# Patient Record
Sex: Male | Born: 1940
Health system: Southern US, Community
[De-identification: ages and names within clinical notes are randomized; demographics above are authoritative.]

## PROBLEM LIST (undated history)

## (undated) DIAGNOSIS — I350 Nonrheumatic aortic (valve) stenosis: Secondary | ICD-10-CM

## (undated) DIAGNOSIS — N200 Calculus of kidney: Secondary | ICD-10-CM

## (undated) DIAGNOSIS — R011 Cardiac murmur, unspecified: Secondary | ICD-10-CM

## (undated) DIAGNOSIS — I2510 Atherosclerotic heart disease of native coronary artery without angina pectoris: Secondary | ICD-10-CM

## (undated) DIAGNOSIS — K219 Gastro-esophageal reflux disease without esophagitis: Secondary | ICD-10-CM

## (undated) HISTORY — DX: Gastro-esophageal reflux disease without esophagitis: K21.9

## (undated) HISTORY — DX: Atherosclerotic heart disease of native coronary artery without angina pectoris: I25.10

## (undated) HISTORY — DX: Nonrheumatic aortic (valve) stenosis: I35.0

## (undated) HISTORY — DX: Calculus of kidney: N20.0

## (undated) HISTORY — DX: Cardiac murmur, unspecified: R01.1

---

## 2009-11-18 HISTORY — PX: COLON RESECTION: SHX5231

## 2012-11-19 ENCOUNTER — Ambulatory Visit: Payer: Self-pay | Admitting: Internal Medicine

## 2014-10-28 HISTORY — PX: COLONOSCOPY: SHX174

## 2015-10-10 ENCOUNTER — Ambulatory Visit: Payer: Self-pay | Admitting: Allergy and Immunology

## 2015-11-21 ENCOUNTER — Encounter: Payer: Self-pay | Admitting: Allergy and Immunology

## 2015-11-21 ENCOUNTER — Ambulatory Visit (INDEPENDENT_AMBULATORY_CARE_PROVIDER_SITE_OTHER): Payer: Medicare Other | Admitting: Allergy and Immunology

## 2015-11-21 VITALS — BP 122/72 | HR 72 | Temp 97.5°F | Resp 16 | Ht 66.54 in | Wt 138.7 lb

## 2015-11-21 DIAGNOSIS — J3489 Other specified disorders of nose and nasal sinuses: Secondary | ICD-10-CM | POA: Diagnosis not present

## 2015-11-21 DIAGNOSIS — J3089 Other allergic rhinitis: Secondary | ICD-10-CM

## 2015-11-21 MED ORDER — IPRATROPIUM BROMIDE 0.06 % NA SOLN
NASAL | Status: DC
Start: 2015-11-21 — End: 2016-06-18

## 2015-11-21 NOTE — Progress Notes (Signed)
[facility]    NEW PATIENT NOTE  Referring Provider: No ref. provider found Primary Provider:  Melinda Crutch, MD Date of office visit: 11/21/2015    Subjective:   Chief Complaint:  Peter Potts is a 75 y.o. male with a chief complaint of Medication Problem  who presents to the clinic with the following problems:  HPI Comments:  Peter Potts presents to this clinic on 11/21/2015 in evaluation of allergic rhinitis. Occur as a long history of allergic rhinitis dating back many years for which he uses Zyrtec which works relatively well. He is somewhat concerned about the consistent use of Zyrtec secondary to some media reports of associated dementia with the use of this medication. As well, he has noticed that he does get some mucus in the back of his throat and some throat clearing when he swims. It is only during the swimming episode that this occurs. He rarely gets any problems while he has not swimming. He does not have any associated symptoms consistent with LPR and he has no classic reflux. He swims on a pretty consistent basis. He does have a history of intermittent asthma and exercise-induced asthma in the past but this has been relatively quiescent and he has not used a short-acting bronchodilator in years.   History reviewed. No pertinent past medical history.  Past Surgical History  Procedure Laterality Date  . Colon resection  2011    No current outpatient prescriptions on file prior to visit.   No current facility-administered medications on file prior to visit.    Meds ordered this encounter  Medications  . ipratropium (ATROVENT) 0.06 % nasal spray    Sig: Use 1 to 2 sprays in each nostril every 6 hours if needed to dry up nose.    Dispense:  15 mL    Refill:  5    No Known Allergies  Review of systems negative except as noted in HPI / PMHx or noted below:  Review of Systems  Constitutional: Negative.    HENT: Negative.   Eyes: Negative.   Respiratory: Negative.   Cardiovascular: Negative.   Gastrointestinal: Negative.        Status post partial colectomy for diverticulitis  Genitourinary: Negative.   Musculoskeletal: Negative.   Skin: Negative.   Neurological: Negative.   Endo/Heme/Allergies: Negative.   Psychiatric/Behavioral: Negative.     Family History  Problem Relation Age of Onset  . Heart Problems Paternal Grandfather     Social History   Social History  . Marital Status: Married    Spouse Name: N/A  . Number of Children: N/A  . Years of Education: N/A   Occupational History  . Not on file.   Social History Main Topics  . Smoking status: Never Smoker   . Smokeless tobacco: Never Used  . Alcohol Use: Not on file  . Drug Use: Not on file  . Sexual Activity: Not on file   Other Topics Concern  . Not on file   Social History Narrative  . No narrative on file    Environmental and Social history  Occur lives in a house with a dry environment, no animals located inside the household, carpeting in the bedroom, plastic on the bed and pillow, and no smoking ongoing with inside the household.   Objective:   Filed Vitals:   11/21/15 1419  BP: 122/72  Pulse: 72  Temp: 97.5 F (36.4 C)  Resp: 16  Height: 5' 6.53" (169 cm) Weight: 138 lb 10.7 oz (62.9 kg)  Physical Exam  Constitutional: He is well-developed, well-nourished, and in no distress. No distress.  HENT:  Head: Normocephalic. Head is without right periorbital erythema and without left periorbital erythema.  Right Ear: Tympanic membrane, external ear and ear canal normal.  Left Ear: Tympanic membrane, external ear and ear canal normal.  Nose: Nose normal. No mucosal edema or rhinorrhea.  Mouth/Throat: Oropharynx is clear and moist and mucous membranes are normal. No oropharyngeal exudate.  Eyes: Conjunctivae and lids are normal. Pupils are equal, round, and reactive to light.  Neck: Trachea  normal. No tracheal deviation present. No thyromegaly present.  Cardiovascular: Normal rate, regular rhythm, S1 normal, S2 normal and normal heart sounds.   No murmur heard. Pulmonary/Chest: Effort normal. No stridor. No tachypnea. No respiratory distress. He has no wheezes. He has no rales. He exhibits no tenderness.  Abdominal: Soft. He exhibits no distension and no mass. There is no hepatosplenomegaly. There is no tenderness. There is no rebound and no guarding.  Musculoskeletal: He exhibits no edema or tenderness.  Lymphadenopathy:       Head (right side): No tonsillar adenopathy present.       Head (left side): No tonsillar adenopathy present.    He has no cervical adenopathy.    He has no axillary adenopathy.  Neurological: He is alert. Gait normal.  Skin: No rash noted. He is not diaphoretic. No erythema. No pallor. Nails show no clubbing.  Psychiatric: Mood and affect normal.     Diagnostics: None  Assessment and Plan:    1. Other allergic rhinitis   2. Rhinorrhea      1. Allegra 180 one tablet one time per day  2. Can use nasal ipratropium 0.06% 1-2 sprays each nostril every 6 hours if needed to dry up nose. Can use 15-20 minutes before swimming.  3. Further evaluation?  I will have Peter Potts use the therapy mentioned above and he will keep in contact with me noting his response. He will contact me should he have continued problems in the face of this treatment. I did switch his Zyrtec to Allegra given his concern about possible Zyrtec-induced dementia. Allegra should not cross into the brain and thus there should be no risk of developing dementia with the use of this medication. He can try to use nasal ipratropium to dry up his swimming-induced mucus production. He'll follow-up in this clinic as needed.   Allena Katz, MD [place]

## 2015-11-21 NOTE — Patient Instructions (Signed)
  1. Allegra 180 one tablet one time per day  2. Can use nasal ipratropium 0.06% 1-2 sprays each nostril every 6 hours if needed to dry up nose. Can use 15-20 minutes before swimming.  3. Further evaluation?

## 2015-12-08 ENCOUNTER — Other Ambulatory Visit: Payer: Self-pay | Admitting: Family Medicine

## 2015-12-08 DIAGNOSIS — R0989 Other specified symptoms and signs involving the circulatory and respiratory systems: Secondary | ICD-10-CM

## 2015-12-12 ENCOUNTER — Ambulatory Visit
Admission: RE | Admit: 2015-12-12 | Discharge: 2015-12-12 | Disposition: A | Discharge status: Home / Self Care | Payer: Medicare Other | Source: Ambulatory Visit | Attending: Family Medicine | Admitting: Family Medicine

## 2015-12-12 DIAGNOSIS — R0989 Other specified symptoms and signs involving the circulatory and respiratory systems: Secondary | ICD-10-CM

## 2016-02-05 ENCOUNTER — Ambulatory Visit: Payer: Medicare Other | Admitting: Cardiology

## 2016-02-07 ENCOUNTER — Ambulatory Visit (INDEPENDENT_AMBULATORY_CARE_PROVIDER_SITE_OTHER): Payer: Medicare Other | Admitting: Cardiology

## 2016-02-07 ENCOUNTER — Encounter: Payer: Self-pay | Admitting: Cardiology

## 2016-02-07 VITALS — BP 124/60 | HR 87 | Ht 66.5 in | Wt 138.0 lb

## 2016-02-07 DIAGNOSIS — R011 Cardiac murmur, unspecified: Secondary | ICD-10-CM | POA: Diagnosis not present

## 2016-02-07 NOTE — Patient Instructions (Signed)
Your physician recommends that you continue on your current medications as directed. Please refer to the Current Medication list given to you today.  Your physician has requested that you have an echocardiogram. Echocardiography is a painless test that uses sound waves to create images of your heart. It provides your doctor with information about the size and shape of your heart and how well your heart's chambers and valves are working. This procedure takes approximately one hour. There are no restrictions for this procedure.  Your physician wants you to follow-up in: 1 year with Dr. Skains.  You will receive a reminder letter in the mail two months in advance. If you don't receive a letter, please call our office to schedule the follow-up appointment.  

## 2016-02-07 NOTE — Progress Notes (Signed)
Cardiology Office Note    Date:  02/07/2016   ID:  Peter Potts, DOB 05-Jun-1941, MRN [id number]  PCP:   Melinda Crutch, MD  Cardiologist:   Candee Furbish, MD     History of Present Illness:  Peter Potts is a 75 y.o. male here for evaluation of aortic systolic murmur at the request of Dr. Harrington Challenger. No prior chest pain, no strokelike symptoms. He has had normal carotid ultrasound. Never smoked. He swims very frequently and ran for many years even a marathon.  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 <50% , minor narrowing on carotid ultrasound 12/12/15. He then went to Dr. Harrington Challenger who heard a heart murmur and sent him here for further evaluation.  He is retired or as he likes to say, rewired. 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  His wife would like him to gain a few pounds. Before he used to run, he weighed 160 pounds. He ran for years and then decided to spare his knees and now swims. He does this up to 4 times per week.    Past Medical History  Diagnosis Date  . Heart murmur   . Kidney stones     Past Surgical History  Procedure Laterality Date  . Colon resection  2011    Outpatient Prescriptions Prior to Visit  Medication Sig Dispense Refill  . aspirin 81 MG tablet Take 81 mg by mouth daily.    . fexofenadine (ALLEGRA) 180 MG tablet Take 180 mg by mouth daily.    . fluticasone (FLONASE) 50 MCG/ACT nasal spray Place 1 spray into both nostrils daily.    Marland Kitchen ipratropium (ATROVENT) 0.06 % nasal spray Use 1 to 2 sprays in each nostril every 6 hours if needed to dry up nose. 15 mL 5  . Omega-3 Fatty Acids (FISH OIL PO) Take 2,400 mg by mouth daily.    . Probiotic Product (SOLUBLE FIBER/PROBIOTICS PO) Take by mouth 2 (two) times daily.    . Red Yeast Rice Extract (RED YEAST RICE PO) Take 1 tablet by mouth daily.     No facility-administered medications prior to visit.     Allergies:   Review of patient's allergies indicates no known allergies.   Social History    Social History  . Marital Status: Married    Spouse Name: N/A  . Number of Children: N/A  . Years of Education: N/A   Social History Main Topics  . Smoking status: Never Smoker   . Smokeless tobacco: Never Used  . Alcohol Use: None  . Drug Use: None  . Sexual Activity: Not Asked   Other Topics Concern  . None   Social History Narrative     Family History:  The patient's family history includes Heart Problems in his paternal grandfather.   ROS:   Please see the history of present illness.    ROS All other systems reviewed and are negative.   PHYSICAL EXAM:   VS:  BP 124/60 mmHg  Pulse 87  Ht 5' 6.5" (1.689 m)  Wt 138 lb (62.596 kg)  BMI 21.94 kg/m2   GEN: Well nourished, well developed, in no acute distress HEENT: normal Neck: no JVD, left greater than right carotid bruit, no masses Cardiac: RRR; 2/6 holosystolic murmur heard best at apex but also heard in the aortic valve position as well, no rubs, or gallops,no edema  Respiratory:  clear to auscultation bilaterally, normal work of breathing GI: soft, nontender, nondistended, +  BS MS: no deformity or atrophy Skin: warm and dry, no rash Neuro:  Alert and Oriented x 3, Strength and sensation are intact Psych: euthymic mood, full affect  Wt Readings from Last 3 Encounters:  02/07/16 138 lb (62.596 kg)  11/21/15 138 lb 10.7 oz (62.9 kg)      Studies/Labs Reviewed:   EKG:  EKG is ordered today.  The ekg ordered today demonstrates Normal sinus rhythm heart rate 87 bpm, no other abnormalities.  Recent Labs: No results found for requested labs within last 365 days.   Lipid Panel No results found for: CHOL, TRIG, HDL, CHOLHDL, VLDL, LDLCALC, LDLDIRECT  Previously total cholesterol was 210 range, with red East rice it is in the 150 range. Triglycerides are improved as well.  Additional studies/ records that were reviewed today include:  Prior office notes reviewed, EKG reviewed, labs  discussed    ASSESSMENT:    1. Murmur      PLAN:  In order of problems listed above:  Heart murmur -Heart murmur seems to be loudest at the apical position and perhaps holosystolic. This may be a mitral regurgitant murmur. I do agree however that I hear in the aortic valve position as well. We will check an echocardiogram to elucidate anatomy. -We discussed the implications of murmurs, potential surgeries, he is currently asymptomatic. -I encouraged him to gain approximate 5 pounds. Do not want to see him drop his BMI below 20. -I'm fine with him continuing with red Yeast Rice extract as well as omega-3. He states that this helped his cholesterol. He has been on this for 10-15 years. He has not tried statin therapy. He does not have any myalgias with these medications/supplements. -Continue with his daily active exercise. He will let me know if any symptoms change. Continue aspirin for prevention.  Carotid bruit -Less than 50% stenosis bilaterally, minor narrowing noted. Explained to him that sometimes with even minor plaque buildup, we would advocate for traditional statin therapy in these situations.     Medication Adjustments/Labs and Tests Ordered: Current medicines are reviewed at length with the patient today.  Concerns regarding medicines are outlined above.  Medication changes, Labs and Tests ordered today are listed in the Patient Instructions below. Patient Instructions  Your physician recommends that you continue on your current medications as directed. Please refer to the Current Medication list given to you today. Your physician has requested that you have an echocardiogram. Echocardiography is a painless test that uses sound waves to create images of your heart. It provides your doctor with information about the size and shape of your heart and how well your heart's chambers and valves are working. This procedure takes approximately one hour. There are no restrictions for  this procedure.  Your physician wants you to follow-up in: 1 year with Dr. Marlou Porch.  You will receive a reminder letter in the mail two months in advance. If you don't receive a letter, please call our office to schedule the follow-up appointment.        Bobby Rumpf, MD  02/07/2016 3:56 PM    [facility] [place], [place], [place]  [address] Phone: [phone]; Fax: [phone]

## 2016-02-23 ENCOUNTER — Other Ambulatory Visit: Payer: Self-pay

## 2016-02-23 ENCOUNTER — Ambulatory Visit (HOSPITAL_COMMUNITY): Payer: Medicare Other | Attending: Internal Medicine

## 2016-02-23 DIAGNOSIS — I517 Cardiomegaly: Secondary | ICD-10-CM | POA: Insufficient documentation

## 2016-02-23 DIAGNOSIS — I351 Nonrheumatic aortic (valve) insufficiency: Secondary | ICD-10-CM | POA: Insufficient documentation

## 2016-02-23 DIAGNOSIS — R011 Cardiac murmur, unspecified: Secondary | ICD-10-CM | POA: Diagnosis not present

## 2016-06-18 ENCOUNTER — Ambulatory Visit (INDEPENDENT_AMBULATORY_CARE_PROVIDER_SITE_OTHER): Payer: Medicare Other | Admitting: Cardiology

## 2016-06-18 ENCOUNTER — Encounter (INDEPENDENT_AMBULATORY_CARE_PROVIDER_SITE_OTHER): Payer: Self-pay

## 2016-06-18 ENCOUNTER — Encounter: Payer: Self-pay | Admitting: Cardiology

## 2016-06-18 VITALS — BP 118/68 | HR 76 | Ht 67.5 in | Wt 137.4 lb

## 2016-06-18 DIAGNOSIS — Z79899 Other long term (current) drug therapy: Secondary | ICD-10-CM | POA: Diagnosis not present

## 2016-06-18 DIAGNOSIS — I6529 Occlusion and stenosis of unspecified carotid artery: Secondary | ICD-10-CM | POA: Insufficient documentation

## 2016-06-18 DIAGNOSIS — E785 Hyperlipidemia, unspecified: Secondary | ICD-10-CM | POA: Diagnosis not present

## 2016-06-18 DIAGNOSIS — I351 Nonrheumatic aortic (valve) insufficiency: Secondary | ICD-10-CM | POA: Diagnosis not present

## 2016-06-18 DIAGNOSIS — I6523 Occlusion and stenosis of bilateral carotid arteries: Secondary | ICD-10-CM

## 2016-06-18 MED ORDER — ATORVASTATIN CALCIUM 20 MG PO TABS: 20.0000 mg | ORAL_TABLET | Freq: Every day | ORAL | 11 refills | Status: DC

## 2016-06-18 NOTE — Progress Notes (Signed)
Cardiology Office Note    Date:  06/18/2016   ID:  Peter Potts, DOB January 16, 1941, MRN [id number]  PCP:   Melinda Crutch, MD  Cardiologist:   Candee Furbish, MD     History of Present Illness:  Peter Potts is a 75 y.o. male Peter Potts) here for Follow-up of aortic murmur Dr. Harrington Challenger. No prior chest pain, no strokelike symptoms. Carotid ultrasound showed less than 50% carotid plaque on ultrasound. Never smoked. He swims very frequently and ran for many years even a marathon.  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 <50% , minor narrowing on carotid ultrasound 12/12/15. He then went to Dr. Harrington Challenger who heard a heart murmur and sent him here for further evaluation.  He is retired or as he likes to say, rewired. 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  His wife would like him to gain a few pounds. Before he used to run, he weighed 160 pounds. He ran for years and then decided to spare his knees and now swims. He does this up to 4 times per week.  We discussed statin therapy today for plaque stabilization of carotids.    Past Medical History:  Diagnosis Date  . Heart murmur   . Kidney stones     Past Surgical History:  Procedure Laterality Date  . COLON RESECTION  2011    Outpatient Medications Prior to Visit  Medication Sig Dispense Refill  . aspirin 81 MG tablet Take 81 mg by mouth daily.    . fexofenadine (ALLEGRA) 180 MG tablet Take 180 mg by mouth daily.    . Omega-3 Fatty Acids (FISH OIL PO) Take 2,400 mg by mouth daily.    . Probiotic Product (SOLUBLE FIBER/PROBIOTICS PO) Take by mouth 2 (two) times daily.    . Red Yeast Rice Extract (RED YEAST RICE PO) Take 1 tablet by mouth daily.    . fluticasone (FLONASE) 50 MCG/ACT nasal spray Place 1 spray into both nostrils daily.    Marland Kitchen ipratropium (ATROVENT) 0.06 % nasal spray Use 1 to 2 sprays in each nostril every 6 hours if needed to dry up nose. 15 mL 5   No facility-administered medications prior to visit.       Allergies:   Review of patient's allergies indicates no known allergies.   Social History   Social History  . Marital status: Married    Spouse name: N/A  . Number of children: N/A  . Years of education: N/A   Social History Main Topics  . Smoking status: Never Smoker  . Smokeless tobacco: Never Used  . Alcohol use None  . Drug use: Unknown  . Sexual activity: Not Asked   Other Topics Concern  . None   Social History Narrative  . None     Family History:  The patient's family history includes Heart Problems in his paternal grandfather.   ROS:   Please see the history of present illness.    ROS All other systems reviewed and are negative.   PHYSICAL EXAM:   VS:  BP 118/68   Pulse 76   Ht 5' 7.5" (1.715 m)   Wt 137 lb 6.4 oz (62.3 kg)   BMI 21.20 kg/m    GEN: Well nourished, well developed, in no acute distress  HEENT: normal  Neck: no JVD, left greater than right carotid bruit, no masses Cardiac: RRR; 2/6 holosystolic murmur heard best at apex but also heard in the aortic valve position as  well, no rubs, or gallops,no edema  Respiratory:  clear to auscultation bilaterally, normal work of breathing GI: soft, nontender, nondistended, + BS MS: no deformity or atrophy  Skin: warm and dry, no rash Neuro:  Alert and Oriented x 3, Strength and sensation are intact Psych: euthymic mood, full affect  Wt Readings from Last 3 Encounters:  06/18/16 137 lb 6.4 oz (62.3 kg)  02/07/16 138 lb (62.6 kg)  11/21/15 138 lb 10.7 oz (62.9 kg)      Studies/Labs Reviewed:   EKG:  EKG is ordered today.  The ekg ordered today demonstrates Normal sinus rhythm heart rate 87 bpm, no other abnormalities.  ECHO 02/23/16:  - Left ventricle: The cavity size was normal. Wall thickness was   increased in a pattern of mild LVH. Systolic function was normal.   The estimated ejection fraction was in the range of 50% to 55%.   Doppler parameters are consistent with abnormal left  ventricular   relaxation (grade 1 diastolic dysfunction). - Aortic valve: There was mild regurgitation.  Recent Labs: No results found for requested labs within last 8760 hours.   Lipid Panel No results found for: CHOL, TRIG, HDL, CHOLHDL, VLDL, LDLCALC, LDLDIRECT  Previously total cholesterol was 210 range, with red East rice it is in the 150 range. Triglycerides are improved as well.  Additional studies/ records that were reviewed today include:  Prior office notes reviewed, EKG reviewed, labs discussed    ASSESSMENT:    1. Aortic regurgitation   2. Carotid artery plaque, bilateral   3. Hyperlipidemia   4. Encounter for long-term (current) use of medications      PLAN:  In order of problems listed above:  Heart murmur/ Aortic regurgitation mild.  -Aortic regurgitant murmur noted, mild, should not lead to any further surgeries. Discussed at length. Continue to monitor. May not need future echocardiogram unless symptoms change. -I encouraged him to gain approximate 5 pounds. Do not want to see him drop his BMI below 20. -Given his carotid plaque, I've encouraged him to try atorvastatin 20 g once a day. He is hesitant at first because of the things he has heard about statin therapy and I tried to counsel him on this and the benefit of prevention especially in the setting of plaque/atherosclerotic process noted. We will start and have him come back in in 3 months for lipid panel and ALT. He will let me know if any symptoms arise. Continue aspirin for prevention.  Carotid bruit -Less than 50% stenosis bilaterally, minor narrowing noted. Explained to him that sometimes with even minor plaque buildup, we would advocate for traditional statin therapy in these situations. He is willing to try atorvastatin 20 mg.  Secondary prevention  - Since there is evidence of carotid atherosclerosis, we will start atorvastatin.  25 minutes spent with patient and consultation,  discussion.   Medication Adjustments/Labs and Tests Ordered: Current medicines are reviewed at length with the patient today.  Concerns regarding medicines are outlined above.  Medication changes, Labs and Tests ordered today are listed in the Patient Instructions below. Patient Instructions  Medication Instructions:  Please start Atorvastatin 20 mg a day. Continue all other medications as listed.  Labwork: Please have blood work in 3 months (fsting lipid/ALT)  Follow-Up: Follow up in 1 year with Dr. Marlou Porch.  You will receive a letter in the mail 2 months before you are due.  Please call us when you receive this letter to schedule your follow up appointment.  If  you need a refill on your cardiac medications before your next appointment, please call your pharmacy.  Thank you for choosing [facility]!!            Signed, Candee Furbish, MD  06/18/2016 10:12 AM    [address], [place], [place]  [address] Phone: [phone]; Fax: [phone]

## 2016-06-18 NOTE — Patient Instructions (Signed)
Medication Instructions:  Please start Atorvastatin 20 mg a day. Continue all other medications as listed.  Labwork: Please have blood work in 3 months (fsting lipid/ALT)  Follow-Up: Follow up in 1 year with Dr. Marlou Porch.  You will receive a letter in the mail 2 months before you are due.  Please call us when you receive this letter to schedule your follow up appointment.  If you need a refill on your cardiac medications before your next appointment, please call your pharmacy.  Thank you for choosing [facility]!!

## 2016-08-02 ENCOUNTER — Encounter: Payer: Self-pay | Admitting: Cardiology

## 2016-08-20 ENCOUNTER — Telehealth: Payer: Self-pay | Admitting: *Deleted

## 2016-08-20 NOTE — Telephone Encounter (Signed)
Called to speak with pt since I received a MyChart message that pt had not read the response to his concern.  He reports he did see it.  He held his atorvastatin for 3 or 4 days and has been better.  He restarted meds about 1& 1/2 weeks ago.  He states he has only noticed a sore place in his left arm that comes and goes.  Advised to call back he if notices increase in muscle pain.  He is in agreement.  This message is to inform you that the patient has not yet read the following message. (Notification date: August 19, 2016)  RE: Non-Urgent Medical Question   From Shellia Cleverly, RN To Sent 08/05/2016 11:54 AM  You may hold your Atorvastatin to see if the soreness resolves.  Please let us know if it does resolve or stays the same.  I would see your PCP to determine the cause of your fever.   Thank you   Previous Messages    ----- Message -----   From: Peter Potts   Sent: 08/02/2016 4:16 PM EDT    To: Candee Furbish, MD  Subject: Non-Urgent Medical Question   I have been taking Atorvastatin for about 45 days. Experiencing aches and soreness in legs and arms plus a slight fever of 100.4 yesterday. Fever normal today. Do I need to change statins?   Peter Potts  [phone]  [email or web address]

## 2016-09-19 ENCOUNTER — Other Ambulatory Visit: Payer: Medicare Other | Admitting: *Deleted

## 2016-09-19 DIAGNOSIS — E785 Hyperlipidemia, unspecified: Secondary | ICD-10-CM

## 2016-09-19 DIAGNOSIS — Z79899 Other long term (current) drug therapy: Secondary | ICD-10-CM

## 2016-09-19 LAB — LIPID PANEL
CHOL/HDL RATIO: 2.6 ratio (ref <=5.0)
CHOLESTEROL: 129 mg/dL (ref 125–200)
HDL: 49 mg/dL (ref >=40)
LDL Cholesterol: 64 mg/dL (ref <130)
TRIGLYCERIDES: 79 mg/dL (ref <150)
VLDL: 16 mg/dL (ref <30)

## 2016-09-19 LAB — ALT: ALT: 12 U/L (ref 9–46)

## 2016-09-23 ENCOUNTER — Telehealth: Payer: Self-pay | Admitting: Cardiology

## 2016-09-23 NOTE — Telephone Encounter (Signed)
New message  Pt call stating he received a call from RN . Please call back to discuss

## 2016-09-23 NOTE — Telephone Encounter (Signed)
Left detailed message of lab results and comments from Dr. Marlou Porch and advised patient to call back with questions or concerns

## 2016-11-22 ENCOUNTER — Encounter: Payer: Self-pay | Admitting: Cardiology

## 2016-12-09 DIAGNOSIS — H532 Diplopia: Secondary | ICD-10-CM | POA: Diagnosis not present

## 2017-02-06 ENCOUNTER — Ambulatory Visit: Payer: Medicare Other | Admitting: Cardiology

## 2017-03-28 DIAGNOSIS — H00012 Hordeolum externum right lower eyelid: Secondary | ICD-10-CM | POA: Diagnosis not present

## 2017-04-28 DIAGNOSIS — L570 Actinic keratosis: Secondary | ICD-10-CM | POA: Diagnosis not present

## 2017-04-28 DIAGNOSIS — B351 Tinea unguium: Secondary | ICD-10-CM | POA: Diagnosis not present

## 2017-05-12 DIAGNOSIS — H2513 Age-related nuclear cataract, bilateral: Secondary | ICD-10-CM | POA: Diagnosis not present

## 2017-05-12 DIAGNOSIS — H04123 Dry eye syndrome of bilateral lacrimal glands: Secondary | ICD-10-CM | POA: Diagnosis not present

## 2017-05-23 DIAGNOSIS — Z7982 Long term (current) use of aspirin: Secondary | ICD-10-CM | POA: Diagnosis not present

## 2017-05-23 DIAGNOSIS — J309 Allergic rhinitis, unspecified: Secondary | ICD-10-CM | POA: Diagnosis not present

## 2017-05-23 DIAGNOSIS — I7090 Unspecified atherosclerosis: Secondary | ICD-10-CM | POA: Diagnosis not present

## 2017-05-23 DIAGNOSIS — R0989 Other specified symptoms and signs involving the circulatory and respiratory systems: Secondary | ICD-10-CM | POA: Diagnosis not present

## 2017-05-23 DIAGNOSIS — B351 Tinea unguium: Secondary | ICD-10-CM | POA: Diagnosis not present

## 2017-05-23 DIAGNOSIS — Z0000 Encounter for general adult medical examination without abnormal findings: Secondary | ICD-10-CM | POA: Diagnosis not present

## 2017-05-23 DIAGNOSIS — I340 Nonrheumatic mitral (valve) insufficiency: Secondary | ICD-10-CM | POA: Diagnosis not present

## 2017-05-29 DIAGNOSIS — R69 Illness, unspecified: Secondary | ICD-10-CM | POA: Diagnosis not present

## 2017-06-18 ENCOUNTER — Other Ambulatory Visit: Payer: Self-pay | Admitting: Cardiology

## 2017-07-01 DIAGNOSIS — Z79899 Other long term (current) drug therapy: Secondary | ICD-10-CM | POA: Diagnosis not present

## 2017-07-01 DIAGNOSIS — Z131 Encounter for screening for diabetes mellitus: Secondary | ICD-10-CM | POA: Diagnosis not present

## 2017-07-01 DIAGNOSIS — Z125 Encounter for screening for malignant neoplasm of prostate: Secondary | ICD-10-CM | POA: Diagnosis not present

## 2017-07-01 DIAGNOSIS — E7800 Pure hypercholesterolemia, unspecified: Secondary | ICD-10-CM | POA: Diagnosis not present

## 2017-07-08 DIAGNOSIS — Z0000 Encounter for general adult medical examination without abnormal findings: Secondary | ICD-10-CM | POA: Diagnosis not present

## 2017-07-18 DIAGNOSIS — J302 Other seasonal allergic rhinitis: Secondary | ICD-10-CM | POA: Diagnosis not present

## 2017-07-18 DIAGNOSIS — R040 Epistaxis: Secondary | ICD-10-CM | POA: Diagnosis not present

## 2017-09-12 ENCOUNTER — Encounter: Payer: Self-pay | Admitting: Cardiology

## 2017-09-12 ENCOUNTER — Ambulatory Visit (INDEPENDENT_AMBULATORY_CARE_PROVIDER_SITE_OTHER): Payer: Medicare HMO | Admitting: Cardiology

## 2017-09-12 VITALS — BP 134/80 | HR 82 | Ht 67.5 in | Wt 133.1 lb

## 2017-09-12 DIAGNOSIS — I351 Nonrheumatic aortic (valve) insufficiency: Secondary | ICD-10-CM

## 2017-09-12 DIAGNOSIS — I6523 Occlusion and stenosis of bilateral carotid arteries: Secondary | ICD-10-CM

## 2017-09-12 DIAGNOSIS — E7800 Pure hypercholesterolemia, unspecified: Secondary | ICD-10-CM | POA: Diagnosis not present

## 2017-09-12 NOTE — Patient Instructions (Signed)
Medication Instructions:  The current medical regimen is effective;  continue present plan and medications.  Testing/Procedures: Your physician has requested that you have an echocardiogram. Echocardiography is a painless test that uses sound waves to create images of your heart. It provides your doctor with information about the size and shape of your heart and how well your heart's chambers and valves are working. This procedure takes approximately one hour. There are no restrictions for this procedure.  Your physician has requested that you have a carotid duplex. This test is an ultrasound of the carotid arteries in your neck. It looks at blood flow through these arteries that supply the brain with blood. Allow one hour for this exam. There are no restrictions or special instructions.  Follow-Up: Follow up in 1 year with Dr. Marlou Porch.  You will receive a letter in the mail 2 months before you are due.  Please call us when you receive this letter to schedule your follow up appointment.  If you need a refill on your cardiac medications before your next appointment, please call your pharmacy.  Thank you for choosing [facility]!!

## 2017-09-12 NOTE — Progress Notes (Signed)
Cardiology Office Note    Date:  09/12/2017   ID:  Peter Potts, DOB 17-Apr-1941, MRN [id number]  PCP:  Lawerance Cruel, MD  Cardiologist:   Candee Furbish, MD     History of Present Illness:  Peter Potts is a 76 y.o. male Peter Potts) here for follow-up of aortic murmur, Dr. Harrington Challenger. No prior chest pain, no strokelike symptoms. Carotid ultrasound showed less than 50% carotid plaque on ultrasound. Never smoked. He swims very frequently and ran for many years even a marathon.  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 <50% , minor narrowing on carotid ultrasound 12/12/15. He then went to Dr. Harrington Challenger who heard a heart murmur and sent him here for further evaluation.  He is retired or as he likes to say, rewired. 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  His wife would like him to gain a few pounds. Before he used to run, he weighed 160 pounds. He ran for years and then decided to spare his knees and now swims. He does this up to 4 times per week.  He is currently taking his statin medication for plaque stabilization especially given his known carotid artery plaque.  We had lengthy discussion about the utility of stress testing etc.  Since he is asymptomatic no stress testing is needed.  Coronary artery CT was discussed to determine calcium score in asymptomatic patients.  Ultimately, this would lead to current therapy that he is on.  Statin.      Past Medical History:  Diagnosis Date  . Heart murmur   . Kidney stones     Past Surgical History:  Procedure Laterality Date  . COLON RESECTION  2011    Outpatient Medications Prior to Visit  Medication Sig Dispense Refill  . aspirin 81 MG tablet Take 81 mg by mouth daily.    Marland Kitchen atorvastatin (LIPITOR) 20 MG tablet TAKE 1 TABLET (20 MG TOTAL) BY MOUTH DAILY. 30 tablet 2  . fexofenadine (ALLEGRA) 180 MG tablet Take 180 mg by mouth daily.    . Omega-3 Fatty Acids (FISH OIL PO) Take 2,400 mg by mouth daily.    . Probiotic Product  (SOLUBLE FIBER/PROBIOTICS PO) Take by mouth 2 (two) times daily.    . Red Yeast Rice Extract (RED YEAST RICE PO) Take 1 tablet by mouth daily.     No facility-administered medications prior to visit.      Allergies:   Patient has no known allergies.   Social History   Social History  . Marital status: Married    Spouse name: N/A  . Number of children: N/A  . Years of education: N/A   Social History Main Topics  . Smoking status: Never Smoker  . Smokeless tobacco: Never Used  . Alcohol use None  . Drug use: Unknown  . Sexual activity: Not Asked   Other Topics Concern  . None   Social History Narrative  . None     Family History:  The patient's family history includes Heart Problems in his paternal grandfather.   ROS:   Please see the history of present illness.    ROS All other systems reviewed and are negative.   PHYSICAL EXAM:   VS:  BP 134/80   Pulse 82   Ht 5' 7.5" (1.715 m)   Wt 133 lb 1.9 oz (60.4 kg)   SpO2 98%   BMI 20.54 kg/m    GEN: Well nourished, well developed, in no acute distress  HEENT: normal  Neck: no JVD, carotid bruits, or masses Cardiac: rrr; 2/6 systolic right upper sternal border murmur, no rubs, or gallops,no edema  Respiratory:  clear to auscultation bilaterally, normal work of breathing GI: soft, nontender, nondistended, + BS MS: no deformity or atrophy  Skin: warm and dry, no rash Neuro:  Alert and Oriented x 3, Strength and sensation are intact Psych: euthymic mood, full affect   Wt Readings from Last 3 Encounters:  09/12/17 133 lb 1.9 oz (60.4 kg)  06/18/16 137 lb 6.4 oz (62.3 kg)  02/07/16 138 lb (62.6 kg)      Studies/Labs Reviewed:   EKG:  EKG is ordered today.  The ekg ordered today demonstrates normal sinus rhythm 82 with no other abnormalities personally viewed-prior normal sinus rhythm heart rate 87 bpm, no other abnormalities.  ECHO 02/23/16:  - Left ventricle: The cavity size was normal. Wall thickness was    increased in a pattern of mild LVH. Systolic function was normal.   The estimated ejection fraction was in the range of 50% to 55%.   Doppler parameters are consistent with abnormal left ventricular   relaxation (grade 1 diastolic dysfunction). - Aortic valve: There was mild regurgitation.  Recent Labs: 09/19/2016: ALT 12   Lipid Panel    Component Value Date/Time   CHOL 129 09/19/2016 0756   TRIG 79 09/19/2016 0756   HDL 49 09/19/2016 0756   CHOLHDL 2.6 09/19/2016 0756   VLDL 16 09/19/2016 0756   LDLCALC 64 09/19/2016 0756    Previously total cholesterol was 210 range, with red yeast rice it is in the 150 range. Triglycerides are improved as well.  LDL on 07/01/17 was 74  Additional studies/ records that were reviewed today include:  Prior office notes reviewed, EKG reviewed, labs discussed    ASSESSMENT:    1. Nonrheumatic aortic valve insufficiency   2. Carotid artery plaque, bilateral   3. Pure hypercholesterolemia      PLAN:  In order of problems listed above:  Heart murmur/ Aortic regurgitation mild.  -We will check an echocardiogram to monitor stability of his aortic regurgitation.  Carotid bruit -Less than 50% stenosis bilaterally, minor narrowing noted.  -Continue with statin therapy.  No symptoms.  Hyperlipidemia -Tolerating statin therapy well.  Aggressive secondary prevention.  25 minutes spent with patient and consultation, discussion.   Medication Adjustments/Labs and Tests Ordered: Current medicines are reviewed at length with the patient today.  Concerns regarding medicines are outlined above.  Medication changes, Labs and Tests ordered today are listed in the Patient Instructions below. Patient Instructions  Medication Instructions:  The current medical regimen is effective;  continue present plan and medications.  Testing/Procedures: Your physician has requested that you have an echocardiogram. Echocardiography is a painless test that uses  sound waves to create images of your heart. It provides your doctor with information about the size and shape of your heart and how well your heart's chambers and valves are working. This procedure takes approximately one hour. There are no restrictions for this procedure.  Your physician has requested that you have a carotid duplex. This test is an ultrasound of the carotid arteries in your neck. It looks at blood flow through these arteries that supply the brain with blood. Allow one hour for this exam. There are no restrictions or special instructions.  Follow-Up: Follow up in 1 year with Dr. Marlou Porch.  You will receive a letter in the mail 2 months before you are due.  Please  call us when you receive this letter to schedule your follow up appointment.  If you need a refill on your cardiac medications before your next appointment, please call your pharmacy.  Thank you for choosing [facility]!!          Signed, Candee Furbish, MD  09/12/2017 9:10 AM    [facility] [place], [place], [place]  [address] Phone: [phone]; Fax: [phone]

## 2017-09-17 ENCOUNTER — Other Ambulatory Visit: Payer: Self-pay | Admitting: Cardiology

## 2017-09-18 ENCOUNTER — Ambulatory Visit (HOSPITAL_COMMUNITY): Payer: Medicare HMO | Attending: Cardiovascular Disease

## 2017-09-18 ENCOUNTER — Other Ambulatory Visit: Payer: Self-pay

## 2017-09-18 DIAGNOSIS — I351 Nonrheumatic aortic (valve) insufficiency: Secondary | ICD-10-CM

## 2017-09-18 LAB — ECHOCARDIOGRAM COMPLETE
AO mean calculated velocity dopler: 157 cm/s
AV Area VTI index: 0.73 cm2/m2
AV Area mean vel: 1.22 cm2
AV Mean grad: 11 mmHg
AV Peak grad: 23 mmHg
AV area mean vel ind: 0.72 cm2/m2
AV peak Index: 0.74
AV vel: 1.23
AVAREAVTI: 1.26 cm2
AVCELMEANRAT: 0.35
AVPHT: 483 ms
AVPKVEL: 240 cm/s
Ao pk vel: 0.36 m/s
Area-P 1/2: 3.01 cm2
CHL CUP MV DEC (S): 250
E decel time: 250 msec
E/e' ratio: 8.12
FS: 41 % (ref 28–44)
IVS/LV PW RATIO, ED: 1.2
LA diam index: 1.95 cm/m2
LA vol A4C: 28.7 ml
LA vol index: 23.2 mL/m2
LASIZE: 33 mm
LAVOL: 39.3 mL
LDCA: 3.46 cm2
LEFT ATRIUM END SYS DIAM: 33 mm
LV E/e' medial: 8.12
LV PW d: 10 mm — AB (ref 0.6–1.1)
LV e' LATERAL: 7.4 cm/s
LVEEAVG: 8.12
LVOT SV: 62 mL
LVOT VTI: 17.9 cm
LVOT diameter: 21 mm
LVOTPV: 87.2 cm/s
LVOTVTI: 0.35 cm
MVPKAVEL: 53.8 m/s
MVPKEVEL: 60.1 m/s
MVSPHT: 73 ms
Peak grad: 210 mmHg
RV LATERAL S' VELOCITY: 11.7 cm/s
RV TAPSE: 20.4 mm
RV sys press: 21 mmHg
Reg peak vel: 210 cm/s
TDI e' lateral: 7.4
TDI e' medial: 7.83
TRMAXVEL: 210 cm/s
VTI: 50.5 cm
Valve area index: 0.73
Valve area: 1.23 cm2

## 2017-09-19 ENCOUNTER — Telehealth: Payer: Self-pay | Admitting: *Deleted

## 2017-09-19 DIAGNOSIS — E785 Hyperlipidemia, unspecified: Secondary | ICD-10-CM

## 2017-09-19 DIAGNOSIS — R011 Cardiac murmur, unspecified: Secondary | ICD-10-CM

## 2017-09-19 NOTE — Telephone Encounter (Signed)
Called patient to review results of echocardiogram.  He is requesting a coronary CA+ score test be ordered as discussed with Dr Marlou Porch at his last office visit.  He is aware of the cost and is willing to proceed.  Advised I will obtain an order from Dr Marlou Porch and have someone to call to schedule him.  He was grateful for the information and will await a c/b.

## 2017-09-25 NOTE — Telephone Encounter (Signed)
Agree with ordering calcium score.  Thank you. Candee Furbish, MD

## 2017-09-26 ENCOUNTER — Ambulatory Visit (HOSPITAL_COMMUNITY)
Admission: RE | Admit: 2017-09-26 | Discharge: 2017-09-26 | Disposition: A | Discharge status: Home / Self Care | Payer: Medicare HMO | Source: Ambulatory Visit | Attending: Cardiovascular Disease | Admitting: Cardiovascular Disease

## 2017-09-26 DIAGNOSIS — I6523 Occlusion and stenosis of bilateral carotid arteries: Secondary | ICD-10-CM | POA: Insufficient documentation

## 2017-10-07 ENCOUNTER — Other Ambulatory Visit: Payer: Self-pay | Admitting: *Deleted

## 2017-10-07 ENCOUNTER — Ambulatory Visit (INDEPENDENT_AMBULATORY_CARE_PROVIDER_SITE_OTHER)
Admission: RE | Admit: 2017-10-07 | Discharge: 2017-10-07 | Disposition: A | Discharge status: Home / Self Care | Payer: Self-pay | Source: Ambulatory Visit | Attending: Cardiology | Admitting: Cardiology

## 2017-10-07 DIAGNOSIS — E785 Hyperlipidemia, unspecified: Secondary | ICD-10-CM

## 2017-10-07 DIAGNOSIS — R011 Cardiac murmur, unspecified: Secondary | ICD-10-CM

## 2017-10-07 MED ORDER — ATORVASTATIN CALCIUM 40 MG PO TABS: 20.0000 mg | ORAL_TABLET | Freq: Every day | ORAL | 3 refills | Status: DC

## 2017-10-13 ENCOUNTER — Encounter: Payer: Self-pay | Admitting: Cardiology

## 2017-10-13 DIAGNOSIS — R931 Abnormal findings on diagnostic imaging of heart and coronary circulation: Secondary | ICD-10-CM

## 2017-10-13 DIAGNOSIS — E7800 Pure hypercholesterolemia, unspecified: Secondary | ICD-10-CM

## 2017-10-13 DIAGNOSIS — I351 Nonrheumatic aortic (valve) insufficiency: Secondary | ICD-10-CM

## 2017-10-15 NOTE — Telephone Encounter (Signed)
Orders placed for myoview stress testing.

## 2017-10-17 ENCOUNTER — Telehealth: Payer: Self-pay | Admitting: *Deleted

## 2017-10-17 NOTE — Telephone Encounter (Signed)
-----   Message sent from Jerline Pain, MD to Peter Potts at 10/15/2017 5:53 PM -----   [name],    After reviewing the cardiac CT results, there is evidence of coronary calcification present. I'm certainly glad that you're taking the increased statin dose. This will help stabilize plaque. Even though you're not having any current symptoms, I think it would be reasonable to pursue a stress test to see if there is any evidence of flow limitation based upon the coronary calcium present. Our office will be in contact with you to set up stress test. Thank you.    Candee Furbish, MD      ----- Message -----   From: Peter Potts   Sent: 10/13/2017 2:57 PM EST    To: Candee Furbish, MD  Subject: Visit Follow-Up Question    I have questions based on the recent tests and the results, especially the CT Scan? Is it possible to schedule to talk with Dr. Marlou Porch?    Peter Potts  [phone]   Left message for pt to c/b to discuss having a stress test completed based on the results of his recent CA score.

## 2017-10-20 NOTE — Telephone Encounter (Signed)
Spoke with patient in detail about myoview stress testing, the instructions and answered all questions.  He does feel he is able to walk on the GXT for the testing to be completed.  Understands instructions and will await a c/b to be scheduled.

## 2017-10-21 ENCOUNTER — Telehealth (HOSPITAL_COMMUNITY): Payer: Self-pay | Admitting: *Deleted

## 2017-10-21 NOTE — Telephone Encounter (Signed)
Patient given detailed instructions per Myocardial Perfusion Study Information Sheet for the test on 10/24/17 at 8:15. Patient notified to arrive 15 minutes early and that it is imperative to arrive on time for appointment to keep from having the test rescheduled.  If you need to cancel or reschedule your appointment, please call the office within 24 hours of your appointment. . Patient verbalized understanding.Peter Potts

## 2017-10-21 NOTE — Telephone Encounter (Signed)
Left message on voicemail in reference to upcoming appointment scheduled for 10/24/17. Phone number given for a call back so details instructions can be given. Peter Potts, [name]

## 2017-10-24 ENCOUNTER — Ambulatory Visit (HOSPITAL_COMMUNITY): Payer: Medicare HMO | Attending: Cardiovascular Disease

## 2017-10-24 DIAGNOSIS — E7800 Pure hypercholesterolemia, unspecified: Secondary | ICD-10-CM | POA: Diagnosis not present

## 2017-10-24 DIAGNOSIS — R931 Abnormal findings on diagnostic imaging of heart and coronary circulation: Secondary | ICD-10-CM

## 2017-10-24 DIAGNOSIS — I351 Nonrheumatic aortic (valve) insufficiency: Secondary | ICD-10-CM | POA: Diagnosis not present

## 2017-10-24 LAB — MYOCARDIAL PERFUSION IMAGING
CHL CUP NUCLEAR SDS: 1
CHL CUP NUCLEAR SRS: 2
CHL CUP RESTING HR STRESS: 88 {beats}/min
CSEPED: 9 min
CSEPHR: 87 %
CSEPPHR: 127 {beats}/min
Estimated workload: 10.1 METS
Exercise duration (sec): 0 s
LV dias vol: 71 mL (ref 62–150)
LV sys vol: 32 mL
MPHR: 145 {beats}/min
RATE: 0.3
SSS: 3
TID: 0.87

## 2017-10-24 MED ORDER — TECHNETIUM TC 99M TETROFOSMIN IV KIT
32.7000 | PACK | Freq: Once | INTRAVENOUS | Status: AC | PRN
  Administered 2017-10-24: 32.7 via INTRAVENOUS
  Filled 2017-10-24: qty 33

## 2017-10-24 MED ORDER — TECHNETIUM TC 99M TETROFOSMIN IV KIT
10.1000 | PACK | Freq: Once | INTRAVENOUS | Status: AC | PRN
  Administered 2017-10-24: 10.1 via INTRAVENOUS
  Filled 2017-10-24: qty 11

## 2017-10-31 ENCOUNTER — Telehealth: Payer: Self-pay | Admitting: Cardiology

## 2017-10-31 NOTE — Telephone Encounter (Signed)
Pt saw his normal stress test results per Dr Marlou Porch, via [place].  Pt states that this was ordered, for he had a high calcium score.  Pt states that being his stress test is normal, does Dr Marlou Porch want him to continue his lipid therapy as prescribed, or does this need to be readjusted? Informed the pt that Dr Marlou Porch and RN are out of the office at this time, but I will route message to them for further review and follow-up with the pt.  Pt verbalized understanding and agrees with this plan.  Pt gracious for the assistance provided.

## 2017-10-31 NOTE — Telephone Encounter (Signed)
Notified the pt that per Dr Marlou Porch, he should continue his lipid therapy, being his stress test showed no ischemia and he has known coronary calcium.  Pt verbalized understanding and agrees with this plan.

## 2017-10-31 NOTE — Telephone Encounter (Signed)
Peter Potts is calling to discuss his stress test results with you and want to where does he go from here . Please call

## 2017-10-31 NOTE — Telephone Encounter (Signed)
Continue lipid therapy. Has Coronary calcium. Thankfully, no ischemia.  Candee Furbish, MD

## 2017-11-25 DIAGNOSIS — B351 Tinea unguium: Secondary | ICD-10-CM | POA: Diagnosis not present

## 2017-11-25 DIAGNOSIS — L570 Actinic keratosis: Secondary | ICD-10-CM | POA: Diagnosis not present

## 2017-11-25 DIAGNOSIS — L578 Other skin changes due to chronic exposure to nonionizing radiation: Secondary | ICD-10-CM | POA: Diagnosis not present

## 2017-11-25 DIAGNOSIS — L821 Other seborrheic keratosis: Secondary | ICD-10-CM | POA: Diagnosis not present

## 2017-11-27 ENCOUNTER — Encounter: Payer: Self-pay | Admitting: Cardiology

## 2018-01-13 ENCOUNTER — Encounter: Payer: Self-pay | Admitting: Allergy and Immunology

## 2018-01-13 ENCOUNTER — Ambulatory Visit (INDEPENDENT_AMBULATORY_CARE_PROVIDER_SITE_OTHER): Payer: Medicare HMO | Admitting: Allergy and Immunology

## 2018-01-13 VITALS — BP 122/70 | HR 84 | Resp 16

## 2018-01-13 DIAGNOSIS — J3089 Other allergic rhinitis: Secondary | ICD-10-CM | POA: Diagnosis not present

## 2018-01-13 DIAGNOSIS — K219 Gastro-esophageal reflux disease without esophagitis: Secondary | ICD-10-CM | POA: Diagnosis not present

## 2018-01-13 MED ORDER — OMEPRAZOLE 40 MG PO CPDR: 40.0000 mg | DELAYED_RELEASE_CAPSULE | Freq: Every day | ORAL | 1 refills | Status: DC

## 2018-01-13 MED ORDER — RANITIDINE HCL 300 MG PO TABS: 300.0000 mg | ORAL_TABLET | Freq: Every day | ORAL | 1 refills | Status: DC

## 2018-01-13 NOTE — Patient Instructions (Signed)
  1.  Continue Flonase 1-2 sprays each nostril 1 time per day  2.  Continue OTC Allegra 180 mg 1 time per day  3.  Start treatment for reflux with the following:   A.  Consolidate caffeinated drink as much as possible  B.  Start omeprazole 40 mg in a.m.  C.  Start ranitidine 300 mg in p.m.  4.  Return to clinic in 6 weeks or earlier if problem

## 2018-01-13 NOTE — Progress Notes (Signed)
Follow-up Note  Referring Provider: Lawerance Cruel, MD Primary Provider: Lawerance Cruel, MD Date of Office Visit: 01/13/2018  Subjective:   Peter Potts (DOB: 1940/12/10) is a 77 y.o. male who returns to the [facility] [place] on 01/13/2018 in re-evaluation of the following:  HPI: Peter Potts presents to this clinic in evaluation of allergic rhinitis.  I have not seen him in this clinic since 21 November 2015.  Overall he believes that his nose is doing relatively well on a nasal steroid and antihistamine. His big complaint at this point in time is the fact that he has drainage in his throat.  This has been a long-standing issue but may have become somewhat worse the past year.  He has associated throat clearing and occasionally gets some raspy voice.  He does not have any classic reflux symptoms.  Allergies as of 01/13/2018   No Known Allergies     Medication List      aspirin 81 MG tablet Take 81 mg by mouth daily.   atorvastatin 40 MG tablet Commonly known as:  LIPITOR Take 0.5 tablets (20 mg total) by mouth daily.   fexofenadine 180 MG tablet Commonly known as:  ALLEGRA Take 180 mg by mouth daily.   FISH OIL PO Take 2,400 mg by mouth daily.   fluticasone 50 MCG/ACT nasal spray Commonly known as:  FLONASE Place 1 spray into both nostrils daily.   zolpidem 10 MG tablet Commonly known as:  AMBIEN Take 10 mg by mouth at bedtime as needed.       Past Medical History:  Diagnosis Date  . Heart murmur   . Kidney stones     Past Surgical History:  Procedure Laterality Date  . COLON RESECTION  2011    Review of systems negative except as noted in HPI / PMHx or noted below:  Review of Systems  Constitutional: Negative.   HENT: Negative.   Eyes: Negative.   Respiratory: Negative.   Cardiovascular: Negative.   Gastrointestinal: Negative.   Genitourinary: Negative.   Musculoskeletal: Negative.   Skin: Negative.   Neurological: Negative.    Endo/Heme/Allergies: Negative.   Psychiatric/Behavioral: Negative.      Objective:   Vitals:   01/13/18 1544  BP: 122/70  Pulse: 84  Resp: 16          Physical Exam  Constitutional: He is well-developed, well-nourished, and in no distress.  HENT:  Head: Normocephalic.  Right Ear: Tympanic membrane, external ear and ear canal normal.  Left Ear: Tympanic membrane, external ear and ear canal normal.  Nose: Nose normal. No mucosal edema or rhinorrhea.  Mouth/Throat: Uvula is midline, oropharynx is clear and moist and mucous membranes are normal. No oropharyngeal exudate.  Eyes: Conjunctivae are normal.  Neck: Trachea normal. No tracheal tenderness present. No tracheal deviation present. No thyromegaly present.  Cardiovascular: Normal rate, regular rhythm, S1 normal, S2 normal and normal heart sounds.  No murmur heard. Pulmonary/Chest: Breath sounds normal. No stridor. No respiratory distress. He has no wheezes. He has no rales.  Musculoskeletal: He exhibits no edema.  Lymphadenopathy:       Head (right side): No tonsillar adenopathy present.       Head (left side): No tonsillar adenopathy present.    He has no cervical adenopathy.  Neurological: He is alert. Gait normal.  Skin: No rash noted. He is not diaphoretic. No erythema. Nails show no clubbing.  Psychiatric: Mood and affect normal.  Diagnostics: none  Assessment and Plan:   1. Other allergic rhinitis   2. LPRD (laryngopharyngeal reflux disease)     1.  Continue Flonase 1-2 sprays each nostril 1 time per day  2.  Continue OTC Allegra 180 mg 1 time per day  3.  Start treatment for reflux with the following:   A.  Consolidate caffeinated drink as much as possible  B.  Start omeprazole 40 mg in a.m.  C.  Start ranitidine 300 mg in p.m.  4.  Return to clinic in 6 weeks or earlier if problem  Tyge appears to be doing relatively well with his atopic disease while consistently using a nasal steroid and  antihistamine.  I suspect that his throat clearing and drainage in his throat is probably a reflection of reflux and we will try him on a 6-week treatment plan as noted above and make a decision about further evaluation and treatment based upon his response at that point.  Allena Katz, MD Allergy / Immunology [place]

## 2018-01-14 ENCOUNTER — Encounter: Payer: Self-pay | Admitting: Allergy and Immunology

## 2018-01-20 ENCOUNTER — Telehealth: Payer: Self-pay | Admitting: Allergy and Immunology

## 2018-01-20 ENCOUNTER — Other Ambulatory Visit: Payer: Self-pay

## 2018-01-20 MED ORDER — RABEPRAZOLE SODIUM 20 MG PO TBEC: 20.0000 mg | DELAYED_RELEASE_TABLET | Freq: Every day | ORAL | 3 refills | Status: DC

## 2018-01-20 NOTE — Telephone Encounter (Signed)
Please inform patient that this is most likely secondary to omeprazole use.  Let us try AcipHex 20 mg tablet 1 time per day and he can report back to [place] with his response.

## 2018-01-20 NOTE — Telephone Encounter (Signed)
Pt informed RX sent

## 2018-01-20 NOTE — Telephone Encounter (Signed)
Spoke with pt, he had stomach pains yesterday and diarrhea over in the night. Has not taken any omeprazole or ranitidine this morning and still having some residual diarrhea. Please advise what he should do next.

## 2018-01-20 NOTE — Telephone Encounter (Signed)
Patient was seen 01-13-18 by Dr. Neldon Mc. He was prescribed some medications and was told to call back if there was any problem with them. He is having stomach pain and diarrhea. He has not taken any this morning because he is waiting on a call back to see what he needs to do.

## 2018-01-22 ENCOUNTER — Telehealth: Payer: Self-pay | Admitting: Allergy and Immunology

## 2018-01-22 NOTE — Telephone Encounter (Signed)
Patient was given a new medication to replace one he was taking The new medication is to expensive - is there a different replacement that could be sent in?? [facility] uses [facility] [address] patient did not specify which new medication or which medication was replaced Patient states the the pharmacy was to be contacting the office about this issue

## 2018-01-23 DIAGNOSIS — H00015 Hordeolum externum left lower eyelid: Secondary | ICD-10-CM | POA: Diagnosis not present

## 2018-01-23 NOTE — Telephone Encounter (Signed)
The medication is asking about is Aciphex 20 mg po qd. I will forward this to Dr. Neldon Mc to get his opinion on Monday.

## 2018-01-23 NOTE — Telephone Encounter (Signed)
Please advise and thank you. 

## 2018-01-26 MED ORDER — LANSOPRAZOLE 15 MG PO CPDR: 15.0000 mg | DELAYED_RELEASE_CAPSULE | Freq: Every day | ORAL | 5 refills | Status: DC

## 2018-01-26 NOTE — Telephone Encounter (Signed)
Please have patient try OTC Prevacid 15 mg capsule or Solutab 1 time per day

## 2018-01-26 NOTE — Telephone Encounter (Signed)
I have informed patient of this and he will take it in the morning and the ranitidine at night.

## 2018-02-24 ENCOUNTER — Ambulatory Visit: Payer: Medicare HMO | Admitting: Allergy and Immunology

## 2018-02-24 ENCOUNTER — Encounter: Payer: Self-pay | Admitting: Allergy and Immunology

## 2018-02-24 VITALS — BP 108/60 | HR 80 | Resp 16

## 2018-02-24 DIAGNOSIS — J3089 Other allergic rhinitis: Secondary | ICD-10-CM | POA: Diagnosis not present

## 2018-02-24 DIAGNOSIS — K219 Gastro-esophageal reflux disease without esophagitis: Secondary | ICD-10-CM

## 2018-02-24 MED ORDER — OMEPRAZOLE 20 MG PO CPDR: 20.0000 mg | DELAYED_RELEASE_CAPSULE | Freq: Every day | ORAL | 3 refills | Status: DC

## 2018-02-24 NOTE — Progress Notes (Signed)
Follow-up Note  Referring Provider: Lawerance Cruel, MD Primary Provider: Lawerance Cruel, MD Date of Office Visit: 02/24/2018  Subjective:   Peter Potts (DOB: 09-27-1941) is a 77 y.o. male who returns to the [facility] [place] on 02/24/2018 in re-evaluation of the following:  HPI: Peter Potts returns to this clinic in reevaluation of his allergic rhinitis and LPR addressed during his last evaluation of 13 January 2018 at which point in time we started therapy for reflux.  He has noticed a dramatic improvement regarding the drainage in his throat and his long-standing throat clearing and raspy voice.  He has basically resolved almost all that issue.  He has eliminated all caffeine consumption and he has been using omeprazole 20 mg in the morning and ranitidine 300 mg in the evening.  He had to decrease his dose of omeprazole because of stomach upset but on 20 mg he is doing well.  As well, his nose has really been doing well while he continues on a nasal steroid and antihistamine.  Allergies as of 02/24/2018   No Known Allergies     Medication List      aspirin 81 MG tablet Take 81 mg by mouth daily.   atorvastatin 40 MG tablet Commonly known as:  LIPITOR Take 0.5 tablets (20 mg total) by mouth daily.   fexofenadine 180 MG tablet Commonly known as:  ALLEGRA Take 180 mg by mouth daily.   FISH OIL PO Take 2,400 mg by mouth daily.   fluticasone 50 MCG/ACT nasal spray Commonly known as:  FLONASE Place 1 spray into both nostrils daily.   lansoprazole 15 MG capsule Commonly known as:  PREVACID 24HR Take 1 capsule (15 mg total) by mouth daily at 12 noon.   omeprazole 20 MG capsule Commonly known as:  PRILOSEC Take 1 capsule (20 mg total) by mouth daily.   ranitidine 300 MG tablet Commonly known as:  ZANTAC Take 1 tablet (300 mg total) by mouth at bedtime.   zolpidem 10 MG tablet Commonly known as:  AMBIEN Take 10 mg by mouth at bedtime as  needed.       Past Medical History:  Diagnosis Date  . Heart murmur   . Kidney stones     Past Surgical History:  Procedure Laterality Date  . COLON RESECTION  2011    Review of systems negative except as noted in HPI / PMHx or noted below:  Review of Systems  Constitutional: Negative.   HENT: Negative.   Eyes: Negative.   Respiratory: Negative.   Cardiovascular: Negative.   Gastrointestinal: Negative.   Genitourinary: Negative.   Musculoskeletal: Negative.   Skin: Negative.   Neurological: Negative.   Endo/Heme/Allergies: Negative.   Psychiatric/Behavioral: Negative.      Objective:   Vitals:   02/24/18 1510  BP: 108/60  Pulse: 80  Resp: 16          Physical Exam  HENT:  Head: Normocephalic.  Right Ear: Tympanic membrane, external ear and ear canal normal.  Left Ear: Tympanic membrane, external ear and ear canal normal.  Nose: Nose normal. No mucosal edema or rhinorrhea.  Mouth/Throat: Uvula is midline, oropharynx is clear and moist and mucous membranes are normal. No oropharyngeal exudate.  Eyes: Conjunctivae are normal.  Neck: Trachea normal. No tracheal tenderness present. No tracheal deviation present. No thyromegaly present.  Cardiovascular: Normal rate, regular rhythm, S1 normal, S2 normal and normal heart sounds.  No murmur heard. Pulmonary/Chest: Breath sounds  normal. No stridor. No respiratory distress. He has no wheezes. He has no rales.  Musculoskeletal: He exhibits no edema.  Lymphadenopathy:       Head (right side): No tonsillar adenopathy present.       Head (left side): No tonsillar adenopathy present.    He has no cervical adenopathy.  Neurological: He is alert.  Skin: No rash noted. He is not diaphoretic. No erythema. Nails show no clubbing.    Diagnostics: none  Assessment and Plan:   1. Other allergic rhinitis   2. LPRD (laryngopharyngeal reflux disease)     1.  Continue Flonase 1-2 sprays each nostril 1 time per day  2.   Continue OTC Allegra 180 mg 1 time per day  3.  Continue treatment for reflux with the following:   A.  Consolidate caffeinated drink  B.  omeprazole 20 mg in a.m.  C.  Ranitidine 300 mg in p.m.  4.  Return to clinic in June 2019 or earlier if problem  Peter Potts is doing much better and will continue on therapy directed against respiratory tract inflammation and reflux until we can see him back in this clinic in June 2019.  At that point he will have had approximately 12-14 weeks of therapy directed against reflux and there may be an opportunity to consolidate his treatment at that point especially given the fact that he has consolidated caffeinated drink consumption.  Allena Katz, MD Allergy / Immunology [place]

## 2018-02-24 NOTE — Patient Instructions (Addendum)
  1.  Continue Flonase 1-2 sprays each nostril 1 time per day  2.  Continue OTC Allegra 180 mg 1 time per day  3.  Continue treatment for reflux with the following:   A.  Consolidate caffeinated drink  B.  omeprazole 20 mg in a.m.  C.  Ranitidine 300 mg in p.m.  4.  Return to clinic in June 2019 or earlier if problem

## 2018-02-25 ENCOUNTER — Encounter: Payer: Self-pay | Admitting: Allergy and Immunology

## 2018-03-05 ENCOUNTER — Telehealth: Payer: Self-pay | Admitting: Cardiology

## 2018-03-05 MED ORDER — ATORVASTATIN CALCIUM 40 MG PO TABS: 40.0000 mg | ORAL_TABLET | Freq: Every day | ORAL | 3 refills | Status: DC

## 2018-03-05 NOTE — Telephone Encounter (Signed)
[facility] pharmacy [facility] is requesting [facility] stating that pt said that he takes Atorvastatin 40 mg tablet daily. The directions on Atorvastatin 40 mg tablet, states that pt should be taking 1/2 tablet by mouth daily. Pharmacy would like a new Rx sent in with correct directions. Please address

## 2018-03-09 ENCOUNTER — Other Ambulatory Visit: Payer: Self-pay | Admitting: Allergy and Immunology

## 2018-03-13 IMAGING — NM NM MISC PROCEDURE
3 series · 18 of 18 positions shown · non-contrast
Comparison: none

[Series 1: stress-gsp_(id)_sa · 6.4mm · 6.40mm/px · 6 of 512 frames shown]
[frame 43/512]
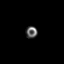
[frame 128/512]
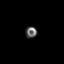
[frame 214/512]
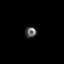
[frame 299/512]
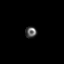
[frame 384/512]
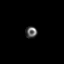
[frame 470/512]
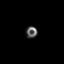

[Series 1: rest_(id)_sa · 6.4mm · 6.40mm/px · 6 of 64 frames shown]
[frame 6/64]
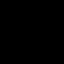
[frame 16/64]
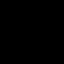
[frame 27/64]
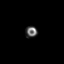
[frame 38/64]
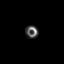
[frame 48/64]
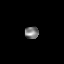
[frame 59/64]
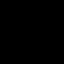

[Series 1: stress-sum-em_(id)_sa · 6.4mm · 6.40mm/px · 6 of 64 frames shown]
[frame 6/64]
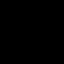
[frame 16/64]
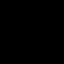
[frame 27/64]
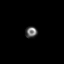
[frame 38/64]
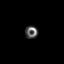
[frame 48/64]
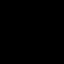
[frame 59/64]
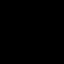

[18 of 18 positions shown; findings below may reference images not displayed]

Canned report from images found in remote index.

Refer to host system for actual result text.

## 2018-03-31 DIAGNOSIS — R69 Illness, unspecified: Secondary | ICD-10-CM | POA: Diagnosis not present

## 2018-03-31 DIAGNOSIS — W0110XA Fall on same level from slipping, tripping and stumbling with subsequent striking against unspecified object, initial encounter: Secondary | ICD-10-CM | POA: Diagnosis not present

## 2018-05-05 ENCOUNTER — Encounter: Payer: Self-pay | Admitting: Allergy and Immunology

## 2018-05-05 ENCOUNTER — Ambulatory Visit: Payer: Medicare HMO | Admitting: Allergy and Immunology

## 2018-05-05 VITALS — BP 128/70 | HR 88 | Resp 20

## 2018-05-05 DIAGNOSIS — J3089 Other allergic rhinitis: Secondary | ICD-10-CM | POA: Diagnosis not present

## 2018-05-05 DIAGNOSIS — K219 Gastro-esophageal reflux disease without esophagitis: Secondary | ICD-10-CM | POA: Diagnosis not present

## 2018-05-05 NOTE — Progress Notes (Signed)
Follow-up Note  Referring Provider: Lawerance Cruel, MD Primary Provider: Lawerance Cruel, MD Date of Office Visit: 05/05/2018  Subjective:   Peter Potts (DOB: 05/08/1941) is a 77 y.o. male who returns to the [facility] on 05/05/2018 in re-evaluation of the following:  HPI: Peter Potts returns to this clinic in reevaluation of his allergic rhinitis and LPR.  His last visit to this clinic was 24 February 2018.    He has continued to do excellent regarding all the issues with his throat while he continues on therapy directed against reflux.  Likewise, he has had very little problems with his eyes or nose while using nasal steroids and a antihistamine.  He is now completely caffeine free.  Allergies as of 05/05/2018   No Known Allergies     Medication List      aspirin 81 MG tablet Take 81 mg by mouth daily.   atorvastatin 40 MG tablet Commonly known as:  LIPITOR Take 1 tablet (40 mg total) by mouth daily.   fexofenadine 180 MG tablet Commonly known as:  ALLEGRA Take 180 mg by mouth daily.   FISH OIL PO Take 2,400 mg by mouth daily.   fluticasone 50 MCG/ACT nasal spray Commonly known as:  FLONASE Place 1 spray into both nostrils daily.   omeprazole 20 MG capsule Commonly known as:  PRILOSEC Take 1 capsule (20 mg total) by mouth daily.   ranitidine 300 MG tablet Commonly known as:  ZANTAC TAKE 1 TABLET BY MOUTH EVERYDAY AT BEDTIME   zolpidem 10 MG tablet Commonly known as:  AMBIEN Take 10 mg by mouth at bedtime as needed.       Past Medical History:  Diagnosis Date  . Heart murmur   . Kidney stones     Past Surgical History:  Procedure Laterality Date  . COLON RESECTION  2011    Review of systems negative except as noted in HPI / PMHx or noted below:  Review of Systems  Constitutional: Negative.   HENT: Negative.   Eyes: Negative.   Respiratory: Negative.   Cardiovascular: Negative.   Gastrointestinal: Negative.     Genitourinary: Negative.   Musculoskeletal: Negative.   Skin: Negative.   Neurological: Negative.   Endo/Heme/Allergies: Negative.   Psychiatric/Behavioral: Negative.      Objective:   Vitals:   05/05/18 1545  BP: 128/70  Pulse: 88  Resp: 20          Physical Exam  HENT:  Head: Normocephalic.  Right Ear: Tympanic membrane, external ear and ear canal normal.  Left Ear: Tympanic membrane, external ear and ear canal normal.  Nose: Nose normal. No mucosal edema or rhinorrhea.  Mouth/Throat: Uvula is midline, oropharynx is clear and moist and mucous membranes are normal. No oropharyngeal exudate.  Eyes: Conjunctivae are normal.  Neck: Trachea normal. No tracheal tenderness present. No tracheal deviation present. No thyromegaly present.  Cardiovascular: Normal rate, regular rhythm, S1 normal, S2 normal and normal heart sounds.  No murmur heard. Pulmonary/Chest: Breath sounds normal. No stridor. No respiratory distress. He has no wheezes. He has no rales.  Musculoskeletal: He exhibits no edema.  Lymphadenopathy:       Head (right side): No tonsillar adenopathy present.       Head (left side): No tonsillar adenopathy present.    He has no cervical adenopathy.  Neurological: He is alert.  Skin: No rash noted. He is not diaphoretic. No erythema. Nails show no clubbing.  Diagnostics: none  Assessment and Plan:   1. Other allergic rhinitis   2. LPRD (laryngopharyngeal reflux disease)     1.  Continue Flonase 1-2 sprays each nostril 1 time per day  2.  Continue OTC Allegra 180 mg 1 time per day  3.  Continue treatment for reflux with the following:   A.  Consolidate caffeinated drink  B.  omeprazole 20 mg in a.m.  C.  Attempt to discontinue ranitidine  4.  Return to clinic in 6 months or earlier if problem  Peter Potts is really doing very well and I think there is an opportunity to try and consolidate his medical therapy by discontinuing his ranitidine at this point.   Assuming he does well we will keep him on a proton pump inhibitor to treat his LPR for an additional 6 months and then there may be an opportunity to discontinue that agent especially given the fact that he has modified his diet and has eliminated all caffeine consumption.  He did raise a very good question today and that question pertains to whether or not he should have an upper endoscopy for his many years of unrecognized reflux disease.  He will discuss this further with his gastroenterologist.  I will see him back in this clinic in 6 months or earlier if there is a problem.  Allena Katz, MD Allergy / Immunology [place]

## 2018-05-05 NOTE — Patient Instructions (Addendum)
  1.  Continue Flonase 1-2 sprays each nostril 1 time per day  2.  Continue OTC Allegra 180 mg 1 time per day  3.  Continue treatment for reflux with the following:   A.  Consolidate caffeinated drink  B.  omeprazole 20 mg in a.m.  C.  Attempt to discontinue ranitidine  4.  Return to clinic in 6 months or earlier if problem

## 2018-05-06 ENCOUNTER — Encounter: Payer: Self-pay | Admitting: Allergy and Immunology

## 2018-05-19 ENCOUNTER — Telehealth: Payer: Self-pay | Admitting: Allergy and Immunology

## 2018-05-19 NOTE — Telephone Encounter (Signed)
Patient was put on the omeprazole. He states since taking it, he has had a lot of gas. He stopped taking it for two days and the gas went away. He would like to know if there is an alternative for this. [facility] ([facility]).

## 2018-05-19 NOTE — Telephone Encounter (Signed)
Dr. Neldon Mc do you have any other recommendations?

## 2018-05-19 NOTE — Telephone Encounter (Signed)
Have him try OTC generic Prevacid 15mg  to replace Omeprazole and report back with response

## 2018-05-19 NOTE — Telephone Encounter (Signed)
I called and spoke with the patient about the OTC replacement. He will follow up with [place] to let us know how it works for him.

## 2018-05-27 DIAGNOSIS — H04123 Dry eye syndrome of bilateral lacrimal glands: Secondary | ICD-10-CM | POA: Diagnosis not present

## 2018-05-27 DIAGNOSIS — H2513 Age-related nuclear cataract, bilateral: Secondary | ICD-10-CM | POA: Diagnosis not present

## 2018-05-27 DIAGNOSIS — H01001 Unspecified blepharitis right upper eyelid: Secondary | ICD-10-CM | POA: Diagnosis not present

## 2018-06-07 ENCOUNTER — Encounter: Payer: Self-pay | Admitting: Cardiology

## 2018-07-09 ENCOUNTER — Other Ambulatory Visit: Payer: Self-pay | Admitting: Cardiology

## 2018-07-09 DIAGNOSIS — I6523 Occlusion and stenosis of bilateral carotid arteries: Secondary | ICD-10-CM

## 2018-07-16 DIAGNOSIS — Z79899 Other long term (current) drug therapy: Secondary | ICD-10-CM | POA: Diagnosis not present

## 2018-07-16 DIAGNOSIS — Z125 Encounter for screening for malignant neoplasm of prostate: Secondary | ICD-10-CM | POA: Diagnosis not present

## 2018-07-16 DIAGNOSIS — E7800 Pure hypercholesterolemia, unspecified: Secondary | ICD-10-CM | POA: Diagnosis not present

## 2018-07-22 DIAGNOSIS — Z79899 Other long term (current) drug therapy: Secondary | ICD-10-CM | POA: Diagnosis not present

## 2018-07-22 DIAGNOSIS — E7800 Pure hypercholesterolemia, unspecified: Secondary | ICD-10-CM | POA: Diagnosis not present

## 2018-07-22 DIAGNOSIS — Z0000 Encounter for general adult medical examination without abnormal findings: Secondary | ICD-10-CM | POA: Diagnosis not present

## 2018-07-22 DIAGNOSIS — Z1389 Encounter for screening for other disorder: Secondary | ICD-10-CM | POA: Diagnosis not present

## 2018-08-20 ENCOUNTER — Ambulatory Visit (HOSPITAL_COMMUNITY)
Admission: RE | Admit: 2018-08-20 | Discharge: 2018-08-20 | Disposition: A | Discharge status: Home / Self Care | Payer: Medicare HMO | Source: Ambulatory Visit | Attending: Cardiology | Admitting: Cardiology

## 2018-08-20 DIAGNOSIS — I6523 Occlusion and stenosis of bilateral carotid arteries: Secondary | ICD-10-CM | POA: Diagnosis not present

## 2018-08-21 ENCOUNTER — Telehealth: Payer: Self-pay

## 2018-08-21 NOTE — Telephone Encounter (Signed)
Notes recorded by Frederik Schmidt, RN on 08/21/2018 at 8:06 AM EDT Informed patient of Carotid US results. He verbalized understanding.

## 2018-08-21 NOTE — Telephone Encounter (Signed)
-----   Message from Jerline Pain, MD sent at 08/21/2018  6:43 AM EDT ----- Mild 1-39% stenosis bilaterally.  Candee Furbish, MD

## 2018-08-24 DIAGNOSIS — R69 Illness, unspecified: Secondary | ICD-10-CM | POA: Diagnosis not present

## 2018-08-28 ENCOUNTER — Other Ambulatory Visit: Payer: Self-pay | Admitting: Allergy and Immunology

## 2018-09-03 ENCOUNTER — Ambulatory Visit (INDEPENDENT_AMBULATORY_CARE_PROVIDER_SITE_OTHER): Payer: Medicare HMO | Admitting: Cardiology

## 2018-09-03 ENCOUNTER — Encounter: Payer: Self-pay | Admitting: Cardiology

## 2018-09-03 VITALS — BP 120/80 | HR 72 | Ht 67.5 in | Wt 129.8 lb

## 2018-09-03 DIAGNOSIS — I351 Nonrheumatic aortic (valve) insufficiency: Secondary | ICD-10-CM

## 2018-09-03 DIAGNOSIS — I6523 Occlusion and stenosis of bilateral carotid arteries: Secondary | ICD-10-CM

## 2018-09-03 DIAGNOSIS — E785 Hyperlipidemia, unspecified: Secondary | ICD-10-CM | POA: Diagnosis not present

## 2018-09-03 DIAGNOSIS — R931 Abnormal findings on diagnostic imaging of heart and coronary circulation: Secondary | ICD-10-CM

## 2018-09-03 NOTE — Progress Notes (Signed)
Cardiology Office Note:    Date:  09/03/2018   ID:  Peter Potts, DOB 06/16/1941, MRN [id number]  PCP:  Lawerance Cruel, MD  Cardiologist:  No primary care provider on file.  Electrophysiologist:  None   Referring MD: Lawerance Cruel, MD     History of Present Illness:    Peter Potts is a 77 y.o. male, goes by Peter Potts, here for follow-up aortic stenosis, regurgitation, Dr. Harrington Challenger.  Mild carotid plaque bilaterally.  Very frequent swimmer, ran for many years.  He was a Customer service manager for 40 years.  Enjoys traveling.  Retired but likes to say, rewired.  We went ahead and did a coronary CT scan which showed a calcium score greater than 400.  This resulted in stress test that was low risk, normal.  He also had carotid ultrasound done which showed mild plaque.  Currently on statin therapy.  Overall he has not had any change in symptoms.  No chest pain, no angina, no syncope bleeding orthopnea PND.  Past Medical History:  Diagnosis Date  . Heart murmur   . Kidney stones     Past Surgical History:  Procedure Laterality Date  . COLON RESECTION  2011    Current Medications: Current Meds  Medication Sig  . aspirin 81 MG tablet Take 81 mg by mouth daily.  Marland Kitchen atorvastatin (LIPITOR) 40 MG tablet Take 1 tablet (40 mg total) by mouth daily.  . fexofenadine (ALLEGRA) 180 MG tablet Take 180 mg by mouth daily.  . fluticasone (FLONASE) 50 MCG/ACT nasal spray Place 1 spray into both nostrils daily.  . Omega-3 Fatty Acids (FISH OIL PO) Take 2,400 mg by mouth daily.  Marland Kitchen omeprazole (PRILOSEC) 20 MG capsule TAKE 1 CAPSULE BY MOUTH EVERY DAY  . zolpidem (AMBIEN) 10 MG tablet Take 10 mg by mouth at bedtime as needed.     Allergies:   Patient has no known allergies.   Social History   Socioeconomic History  . Marital status: Married    Spouse name: Not on file  . Number of children: Not on file  . Years of education: Not on file  . Highest education level: Not on file    Occupational History  . Not on file  Social Needs  . Financial resource strain: Not on file  . Food insecurity:    Worry: Not on file    Inability: Not on file  . Transportation needs:    Medical: Not on file    Non-medical: Not on file  Tobacco Use  . Smoking status: Never Smoker  . Smokeless tobacco: Never Used  Substance and Sexual Activity  . Alcohol use: Not on file  . Drug use: Not on file  . Sexual activity: Not on file  Lifestyle  . Physical activity:    Days per week: Not on file    Minutes per session: Not on file  . Stress: Not on file  Relationships  . Social connections:    Talks on phone: Not on file    Gets together: Not on file    Attends religious service: Not on file    Active member of club or organization: Not on file    Attends meetings of clubs or organizations: Not on file    Relationship status: Not on file  Other Topics Concern  . Not on file  Social History Narrative  . Not on file     Family History: The patient's family history includes Heart Problems  in his paternal grandfather.  ROS:   Please see the history of present illness.    Denies any fevers chills nausea vomiting syncope bleeding all other systems reviewed and are negative.  EKGs/Labs/Other Studies Reviewed:    The following studies were reviewed today:  Calcium score 10/07/18: Ascending Aorta: Normal 3.2 cm Some calcification of the aortic valve noted  Pericardium: Normal  Coronary arteries: Calcium noted in all 3 major epicardial coronary arteries  IMPRESSION: Coronary calcium score of 438 . This was 66 st percentile for age and sex matched control.  NUC stress 10/24/17:  - normal, low risk, no ischemia, no TID  Echo 09/18/17:  - Left ventricle: The cavity size was normal. Wall thickness was   increased in a pattern of mild LVH. Systolic function was normal.   The estimated ejection fraction was in the range of 55% to 60%.   Wall motion was normal; there  were no regional wall motion   abnormalities. Left ventricular diastolic function parameters   were normal. - Aortic valve: There was mild stenosis. There was mild   regurgitation. Valve area (VTI): 1.23 cm^2. Valve area (Vmax):   1.26 cm^2. Valve area (Vmean): 1.22 cm^2.  Carotids: 08/20/18  - mild plaque bilaterally <39%  EKG:  EKG is  ordered today.  The ekg ordered today demonstrates 09/03/2018 shows sinus rhythm 72 with no other abnormalities, no change from prior personally interpreted and reviewed.  Recent Labs: No results found for requested labs within last 8760 hours.  Recent Lipid Panel    Component Value Date/Time   CHOL 129 09/19/2016 0756   TRIG 79 09/19/2016 0756   HDL 49 09/19/2016 0756   CHOLHDL 2.6 09/19/2016 0756   VLDL 16 09/19/2016 0756   LDLCALC 64 09/19/2016 0756    Physical Exam:    VS:  BP 120/80   Pulse 72   Ht 5' 7.5" (1.715 m)   Wt 129 lb 12.8 oz (58.9 kg)   BMI 20.03 kg/m     Wt Readings from Last 3 Encounters:  09/03/18 129 lb 12.8 oz (58.9 kg)  09/12/17 133 lb 1.9 oz (60.4 kg)  06/18/16 137 lb 6.4 oz (62.3 kg)     GEN:  Well nourished, well developed in no acute distress HEENT: Normal NECK: No JVD; No carotid bruits LYMPHATICS: No lymphadenopathy CARDIAC: RRR, 2/6 SM RUSB, no rubs, gallops RESPIRATORY:  Clear to auscultation without rales, wheezing or rhonchi  ABDOMEN: Soft, non-tender, non-distended MUSCULOSKELETAL:  No edema; No deformity  SKIN: Warm and dry NEUROLOGIC:  Alert and oriented x 3 PSYCHIATRIC:  Normal affect   ASSESSMENT:    1. Aortic valve insufficiency, etiology of cardiac valve disease unspecified   2. Bilateral carotid artery stenosis   3. Hyperlipidemia, unspecified hyperlipidemia type   4. Agatston coronary artery calcium score greater than 400   5. Carotid artery plaque, bilateral    PLAN:    In order of problems listed above:  Coronary artery calcification/plaque -Calcium score greater than 400,  stress test no ischemia.  Continue with aggressive secondary prevention including statin therapy, low-dose aspirin.  Asymptomatic.  Once again had lengthy discussion about need for further testing.  We performed nuclear stress test which is a recommendation of the College of cardiology in the setting of high calcium score.  Discussed the pros and cons of cardiac catheterization in the setting of asymptomatic disease.  Discussed [place], [place].  Many questions answered.  Continue with aggressive secondary prevention.  Carotid artery  plaque bilaterally -Mild less than 39%.  Continue with statin and aspirin.  Blood pressure control excellent.  Aortic stenosis/regurgitation -Mild, murmur heard on exam.  No changes symptomatically.  Hyperlipidemia - Atorvastatin 40 mg LDL 57.  Excellent.  1 year follow-up.   Medication Adjustments/Labs and Tests Ordered: Current medicines are reviewed at length with the patient today.  Concerns regarding medicines are outlined above.  Orders Placed This Encounter  Procedures  . EKG 12-Lead   No orders of the defined types were placed in this encounter.   Patient Instructions  Medication Instructions:  The current medical regimen is effective;  continue present plan and medications.  If you need a refill on your cardiac medications before your next appointment, please call your pharmacy.   Follow-Up: At [facility], you and your health needs are our priority.  As part of our continuing mission to provide you with exceptional heart care, we have created designated Provider Care Teams.  These Care Teams include your primary Cardiologist (physician) and Advanced Practice Providers (APPs -  Physician Assistants and Nurse Practitioners) who all work together to provide you with the care you need, when you need it. You will need a follow up appointment in 12 months.  Please call our office 2 months in advance to schedule this appointment.  You may see Dr  Marlou Porch or one of the following Advanced Practice Providers on your designated Care Team:   Truitt Merle, NP Cecilie Kicks, NP . Kathyrn Drown, NP  Thank you for choosing [facility]!!          Signed, Candee Furbish, MD  09/03/2018 8:59 AM    [place]

## 2018-09-03 NOTE — Patient Instructions (Signed)
Medication Instructions:  The current medical regimen is effective;  continue present plan and medications.  If you need a refill on your cardiac medications before your next appointment, please call your pharmacy.   Follow-Up: At [facility], you and your health needs are our priority.  As part of our continuing mission to provide you with exceptional heart care, we have created designated Provider Care Teams.  These Care Teams include your primary Cardiologist (physician) and Advanced Practice Providers (APPs -  Physician Assistants and Nurse Practitioners) who all work together to provide you with the care you need, when you need it. You will need a follow up appointment in 12 months.  Please call our office 2 months in advance to schedule this appointment.  You may see Dr Skains. or one of the following Advanced Practice Providers on your designated Care Team:   Lori Gerhardt, NP Laura Ingold, NP . Jill McDaniel, NP  Thank you for choosing [facility]!!      

## 2018-09-19 ENCOUNTER — Other Ambulatory Visit: Payer: Self-pay | Admitting: Allergy and Immunology

## 2018-11-03 ENCOUNTER — Encounter: Payer: Self-pay | Admitting: Allergy and Immunology

## 2018-11-03 ENCOUNTER — Ambulatory Visit: Payer: Medicare HMO | Admitting: Allergy and Immunology

## 2018-11-03 VITALS — BP 108/70 | HR 90 | Resp 18 | Ht 66.5 in | Wt 130.0 lb

## 2018-11-03 DIAGNOSIS — K219 Gastro-esophageal reflux disease without esophagitis: Secondary | ICD-10-CM | POA: Diagnosis not present

## 2018-11-03 DIAGNOSIS — J3089 Other allergic rhinitis: Secondary | ICD-10-CM

## 2018-11-03 MED ORDER — OMEPRAZOLE 20 MG PO CPDR: DELAYED_RELEASE_CAPSULE | ORAL | 2 refills | Status: DC

## 2018-11-03 MED ORDER — CETIRIZINE HCL 10 MG PO TABS: 10.0000 mg | ORAL_TABLET | Freq: Every day | ORAL | 2 refills | Status: AC

## 2018-11-03 NOTE — Patient Instructions (Addendum)
  1.  Continue Flonase 1-2 sprays each nostril 1 time per day  2.  Continue treatment for reflux with the following:   A.  omeprazole 20 mg 3-7 times a week   3.  Can add OTC Zyrtec 10 mg tablet 1 time per day  4.  Return to clinic in 6 months or earlier if problem  5.  ENT evaluation?

## 2018-11-03 NOTE — Progress Notes (Signed)
Follow-up Note  Referring Provider: Lawerance Cruel, MD Primary Provider: Lawerance Cruel, MD Date of Office Visit: 11/03/2018  Subjective:   Peter Potts (DOB: 14-May-1941) is a 77 y.o. male who returns to the [facility] on 11/03/2018 in re-evaluation of the following:  HPI: Peter Potts returns to this clinic in reevaluation of allergic rhinitis and LPR.  His last visit to this clinic was 05 May 2018.  Overall he has done relatively well although he still continues to have a little bit of throat clearing and some mucus occasionally in his throat.  As well, when using omeprazole every day he has developed some problems with gas.  He did discontinue his ranitidine during his last visit.  Sometimes he thinks that the drainage in his throat comes from his nose although for the most part his nose is open and he does not have any decreased ability to smell or taste and does not have a history of ugly nasal discharge or headaches.  Allergies as of 11/03/2018   No Known Allergies     Medication List      aspirin 81 MG tablet Take 81 mg by mouth daily.   atorvastatin 40 MG tablet Commonly known as:  LIPITOR Take 1 tablet (40 mg total) by mouth daily.   fexofenadine 180 MG tablet Commonly known as:  ALLEGRA Take 180 mg by mouth daily.   FISH OIL PO Take 2,400 mg by mouth daily.   fluticasone 50 MCG/ACT nasal spray Commonly known as:  FLONASE Place 1 spray into both nostrils daily.   omeprazole 20 MG capsule Commonly known as:  PRILOSEC TAKE 1 CAPSULE BY MOUTH EVERY DAY   zolpidem 10 MG tablet Commonly known as:  AMBIEN Take 10 mg by mouth at bedtime as needed.       Past Medical History:  Diagnosis Date  . Heart murmur   . Kidney stones     Past Surgical History:  Procedure Laterality Date  . COLON RESECTION  2011    Review of systems negative except as noted in HPI / PMHx or noted below:  Review of Systems  Constitutional:  Negative.   HENT: Negative.   Eyes: Negative.   Respiratory: Negative.   Cardiovascular: Negative.   Gastrointestinal: Negative.   Genitourinary: Negative.   Musculoskeletal: Negative.   Skin: Negative.   Neurological: Negative.   Endo/Heme/Allergies: Negative.   Psychiatric/Behavioral: Negative.      Objective:   Vitals:   11/03/18 1516  BP: 108/70  Pulse: 90  Resp: 18   Height: 5' 6.5" (168.9 cm)  Weight: 130 lb (59 kg)   Physical Exam Constitutional:      Appearance: He is not diaphoretic.  HENT:     Head: Normocephalic.     Right Ear: Tympanic membrane, ear canal and external ear normal.     Left Ear: Tympanic membrane, ear canal and external ear normal.     Nose: Nose normal. No mucosal edema or rhinorrhea.     Mouth/Throat:     Pharynx: Uvula midline. No oropharyngeal exudate.  Eyes:     Conjunctiva/sclera: Conjunctivae normal.  Neck:     Thyroid: No thyromegaly.     Trachea: Trachea normal. No tracheal tenderness or tracheal deviation.  Cardiovascular:     Rate and Rhythm: Normal rate and regular rhythm.     Heart sounds: Normal heart sounds, S1 normal and S2 normal. No murmur.  Pulmonary:     Effort:  No respiratory distress.     Breath sounds: Normal breath sounds. No stridor. No wheezing or rales.  Lymphadenopathy:     Head:     Right side of head: No tonsillar adenopathy.     Left side of head: No tonsillar adenopathy.     Cervical: No cervical adenopathy.  Skin:    Findings: No erythema or rash.     Nails: There is no clubbing.   Neurological:     Mental Status: He is alert.     Diagnostics: none  Assessment and Plan:   1. Other allergic rhinitis   2. LPRD (laryngopharyngeal reflux disease)     1.  Continue Flonase 1-2 sprays each nostril 1 time per day  2.  Continue treatment for reflux with the following:   A.  omeprazole 20 mg 3-7 times a week   3.  Can add OTC Zyrtec 10 mg tablet 1 time per day  4.  Return to clinic in 6  months or earlier if problem  5.  ENT evaluation?  Overall Peter Potts has done relatively well.  He still has a little mucus in his throat and he will try Zyrtec to see if this results in resolution of that issue.  If not, then he may require ENT evaluation to take a look at his throat to make sure were not dealing with anything above and beyond allergic inflammation and reflux induced respiratory disease giving rise to his symptoms.  If he does do well with Zyrtec then he can attempt to consolidate his omeprazole to the least amount required to control his issue.  I will see him back in his clinic in 6 months or earlier if there is a problem.  Allena Katz, MD Allergy / Immunology [place]

## 2018-11-04 ENCOUNTER — Encounter: Payer: Self-pay | Admitting: Allergy and Immunology

## 2018-12-01 DIAGNOSIS — L821 Other seborrheic keratosis: Secondary | ICD-10-CM | POA: Diagnosis not present

## 2018-12-01 DIAGNOSIS — L578 Other skin changes due to chronic exposure to nonionizing radiation: Secondary | ICD-10-CM | POA: Diagnosis not present

## 2018-12-01 DIAGNOSIS — D1801 Hemangioma of skin and subcutaneous tissue: Secondary | ICD-10-CM | POA: Diagnosis not present

## 2018-12-01 DIAGNOSIS — L570 Actinic keratosis: Secondary | ICD-10-CM | POA: Diagnosis not present

## 2018-12-06 ENCOUNTER — Other Ambulatory Visit: Payer: Self-pay | Admitting: Cardiology

## 2018-12-17 DIAGNOSIS — H6120 Impacted cerumen, unspecified ear: Secondary | ICD-10-CM | POA: Diagnosis not present

## 2018-12-17 DIAGNOSIS — Z87442 Personal history of urinary calculi: Secondary | ICD-10-CM | POA: Diagnosis not present

## 2018-12-17 DIAGNOSIS — Z79899 Other long term (current) drug therapy: Secondary | ICD-10-CM | POA: Diagnosis not present

## 2018-12-17 DIAGNOSIS — E7800 Pure hypercholesterolemia, unspecified: Secondary | ICD-10-CM | POA: Diagnosis not present

## 2018-12-17 DIAGNOSIS — I359 Nonrheumatic aortic valve disorder, unspecified: Secondary | ICD-10-CM | POA: Diagnosis not present

## 2018-12-17 DIAGNOSIS — J302 Other seasonal allergic rhinitis: Secondary | ICD-10-CM | POA: Diagnosis not present

## 2018-12-17 DIAGNOSIS — H6122 Impacted cerumen, left ear: Secondary | ICD-10-CM | POA: Diagnosis not present

## 2019-03-05 DIAGNOSIS — Z0000 Encounter for general adult medical examination without abnormal findings: Secondary | ICD-10-CM | POA: Diagnosis not present

## 2019-03-05 DIAGNOSIS — Z111 Encounter for screening for respiratory tuberculosis: Secondary | ICD-10-CM | POA: Diagnosis not present

## 2019-06-01 DIAGNOSIS — H11151 Pinguecula, right eye: Secondary | ICD-10-CM | POA: Diagnosis not present

## 2019-06-01 DIAGNOSIS — H01001 Unspecified blepharitis right upper eyelid: Secondary | ICD-10-CM | POA: Diagnosis not present

## 2019-06-01 DIAGNOSIS — H25813 Combined forms of age-related cataract, bilateral: Secondary | ICD-10-CM | POA: Diagnosis not present

## 2019-06-03 ENCOUNTER — Other Ambulatory Visit: Payer: Self-pay | Admitting: Cardiology

## 2019-06-29 DIAGNOSIS — R21 Rash and other nonspecific skin eruption: Secondary | ICD-10-CM | POA: Diagnosis not present

## 2019-07-29 DIAGNOSIS — Z79899 Other long term (current) drug therapy: Secondary | ICD-10-CM | POA: Diagnosis not present

## 2019-07-29 DIAGNOSIS — Z111 Encounter for screening for respiratory tuberculosis: Secondary | ICD-10-CM | POA: Diagnosis not present

## 2019-07-29 DIAGNOSIS — E7800 Pure hypercholesterolemia, unspecified: Secondary | ICD-10-CM | POA: Diagnosis not present

## 2019-07-29 DIAGNOSIS — Z0000 Encounter for general adult medical examination without abnormal findings: Secondary | ICD-10-CM | POA: Diagnosis not present

## 2019-08-03 DIAGNOSIS — L570 Actinic keratosis: Secondary | ICD-10-CM | POA: Diagnosis not present

## 2019-08-03 DIAGNOSIS — R351 Nocturia: Secondary | ICD-10-CM | POA: Diagnosis not present

## 2019-08-03 DIAGNOSIS — G479 Sleep disorder, unspecified: Secondary | ICD-10-CM | POA: Diagnosis not present

## 2019-08-05 DIAGNOSIS — R69 Illness, unspecified: Secondary | ICD-10-CM | POA: Diagnosis not present

## 2019-08-13 DIAGNOSIS — L570 Actinic keratosis: Secondary | ICD-10-CM | POA: Diagnosis not present

## 2019-08-29 ENCOUNTER — Other Ambulatory Visit: Payer: Self-pay | Admitting: Cardiology

## 2019-09-07 ENCOUNTER — Other Ambulatory Visit: Payer: Self-pay

## 2019-09-07 ENCOUNTER — Encounter: Payer: Self-pay | Admitting: Cardiology

## 2019-09-07 ENCOUNTER — Ambulatory Visit: Payer: Medicare HMO | Admitting: Cardiology

## 2019-09-07 VITALS — BP 120/70 | HR 71 | Ht 66.5 in | Wt 131.0 lb

## 2019-09-07 DIAGNOSIS — I351 Nonrheumatic aortic (valve) insufficiency: Secondary | ICD-10-CM | POA: Diagnosis not present

## 2019-09-07 DIAGNOSIS — R931 Abnormal findings on diagnostic imaging of heart and coronary circulation: Secondary | ICD-10-CM | POA: Diagnosis not present

## 2019-09-07 DIAGNOSIS — E7800 Pure hypercholesterolemia, unspecified: Secondary | ICD-10-CM | POA: Diagnosis not present

## 2019-09-07 NOTE — Patient Instructions (Signed)
Medication Instructions:  The current medical regimen is effective;  continue present plan and medications.  *If you need a refill on your cardiac medications before your next appointment, please call your pharmacy*  Follow-Up: At [facility], you and your health needs are our priority.  As part of our continuing mission to provide you with exceptional heart care, we have created designated Provider Care Teams.  These Care Teams include your primary Cardiologist (physician) and Advanced Practice Providers (APPs -  Physician Assistants and Nurse Practitioners) who all work together to provide you with the care you need, when you need it.  Your next appointment:   12 months  The format for your next appointment:   In Person  Provider:   You may see Mark Skains, MD or one of the following Advanced Practice Providers on your designated Care Team:    Lori Gerhardt, NP  Laura Ingold, NP  Jill McDaniel, NP   Thank you for choosing [facility]!!      

## 2019-09-07 NOTE — Progress Notes (Signed)
Cardiology Office Note:    Date:  09/07/2019   ID:  Peter Potts, DOB February 18, 1941, MRN [id number]  PCP:  Lawerance Cruel, MD  Cardiologist:  Candee Furbish, MD  Electrophysiologist:  None   Referring MD: Lawerance Cruel, MD     History of Present Illness:    Peter Potts is a 78 y.o. male, goes by [name], here for follow-up aortic stenosis, regurgitation, Dr. Harrington Challenger.  Mild carotid plaque bilaterally.  Very frequent swimmer, ran for many years.  He was a Customer service manager for 40 years.  Enjoys traveling.  Retired but likes to say, rewired.  We went ahead and did a coronary CT scan which showed a calcium score greater than 400.  This resulted in stress test that was low risk, normal.  He also had carotid ultrasound done which showed mild plaque.  Currently on statin therapy.  09/07/2019-here for follow-up of aortic valve disease.  Overall been doing quite well.  No fevers chills nausea vomiting syncope bleeding.  Discussed his various therapies.  Still swimming.  May be feels some slightly more fatigued than usual.  Nothing dramatic.  Past Medical History:  Diagnosis Date  . Heart murmur   . Kidney stones     Past Surgical History:  Procedure Laterality Date  . COLON RESECTION  2011    Current Medications: Current Meds  Medication Sig  . aspirin 81 MG tablet Take 81 mg by mouth daily.  Marland Kitchen atorvastatin (LIPITOR) 40 MG tablet TAKE 1 TABLET BY MOUTH DAILY.  . cetirizine (ZYRTEC) 10 MG tablet Take 1 tablet (10 mg total) by mouth daily.  . fluticasone (FLONASE) 50 MCG/ACT nasal spray Place 1 spray into both nostrils daily.  . Omega-3 Fatty Acids (FISH OIL PO) Take 2,400 mg by mouth daily.  Marland Kitchen zolpidem (AMBIEN) 10 MG tablet Take 10 mg by mouth at bedtime as needed.     Allergies:   Patient has no known allergies.   Social History   Socioeconomic History  . Marital status: Married    Spouse name: Not on file  . Number of children: Not on file  . Years of education:  Not on file  . Highest education level: Not on file  Occupational History  . Not on file  Social Needs  . Financial resource strain: Not on file  . Food insecurity    Worry: Not on file    Inability: Not on file  . Transportation needs    Medical: Not on file    Non-medical: Not on file  Tobacco Use  . Smoking status: Never Smoker  . Smokeless tobacco: Never Used  Substance and Sexual Activity  . Alcohol use: Yes    Alcohol/week: 1.0 - 2.0 standard drinks    Types: 1 - 2 Glasses of wine per week  . Drug use: Never  . Sexual activity: Not on file  Lifestyle  . Physical activity    Days per week: Not on file    Minutes per session: Not on file  . Stress: Not on file  Relationships  . Social Herbalist on phone: Not on file    Gets together: Not on file    Attends religious service: Not on file    Active member of club or organization: Not on file    Attends meetings of clubs or organizations: Not on file    Relationship status: Not on file  Other Topics Concern  . Not on file  Social History Narrative  . Not on file     Family History: The patient's family history includes Heart Problems in his paternal grandfather.  ROS:   Please see the history of present illness.    Denies any fevers chills nausea vomiting syncope bleeding all other systems reviewed and are negative.  EKGs/Labs/Other Studies Reviewed:    The following studies were reviewed today:  Calcium score 10/07/18: Ascending Aorta: Normal 3.2 cm Some calcification of the aortic valve noted  Pericardium: Normal  Coronary arteries: Calcium noted in all 3 major epicardial coronary arteries  IMPRESSION: Coronary calcium score of 438 . This was 67 st percentile for age and sex matched control.  NUC stress 10/24/17:  - normal, low risk, no ischemia, no TID  Echo 09/18/17:  - Left ventricle: The cavity size was normal. Wall thickness was   increased in a pattern of mild LVH. Systolic  function was normal.   The estimated ejection fraction was in the range of 55% to 60%.   Wall motion was normal; there were no regional wall motion   abnormalities. Left ventricular diastolic function parameters   were normal. - Aortic valve: There was mild stenosis. There was mild   regurgitation. Valve area (VTI): 1.23 cm^2. Valve area (Vmax):   1.26 cm^2. Valve area (Vmean): 1.22 cm^2.  Carotids: 08/20/18  - mild plaque bilaterally <39%  EKG:  EKG is  ordered today.  09/07/2019-sinus rhythm 71 no other significant abnormalities.  09/03/2018 shows sinus rhythm 72 with no other abnormalities, no change from prior personally interpreted and reviewed.  Recent Labs: No results found for requested labs within last 8760 hours.  Recent Lipid Panel    Component Value Date/Time   CHOL 129 09/19/2016 0756   TRIG 79 09/19/2016 0756   HDL 49 09/19/2016 0756   CHOLHDL 2.6 09/19/2016 0756   VLDL 16 09/19/2016 0756   LDLCALC 64 09/19/2016 0756    Physical Exam:    VS:  BP 120/70   Pulse 71   Ht 5' 6.5" (1.689 m)   Wt 131 lb (59.4 kg)   SpO2 99%   BMI 20.83 kg/m     Wt Readings from Last 3 Encounters:  09/07/19 131 lb (59.4 kg)  11/03/18 130 lb (59 kg)  09/03/18 129 lb 12.8 oz (58.9 kg)     GEN: Well nourished, well developed, in no acute distress  HEENT: normal  Neck: no JVD, carotid bruits, or masses Cardiac: RRR; 2/6 SM, no rubs, or gallops,no edema  Respiratory:  clear to auscultation bilaterally, normal work of breathing GI: soft, nontender, nondistended, + BS MS: no deformity or atrophy  Skin: warm and dry, no rash Neuro:  Alert and Oriented x 3, Strength and sensation are intact Psych: euthymic mood, full affect   ASSESSMENT:    1. Agatston coronary artery calcium score greater than 400   2. Aortic valve insufficiency, etiology of cardiac valve disease unspecified   3. Hypercholesterolemia   4. Nonrheumatic aortic valve insufficiency    PLAN:    In order of  problems listed above:  Coronary artery calcification/plaque -Calcium score greater than 400, stress test no ischemia.  Continue with aggressive secondary prevention including statin therapy, low-dose aspirin.  Asymptomatic.  Once again had lengthy discussion about need for further testing.  We performed nuclear stress test which is a recommendation of the College of cardiology in the setting of high calcium score.  Discussed previously the pros and cons of cardiac catheterization in the  setting of asymptomatic disease.  Continue with aggressive secondary prevention.  Carotid artery plaque bilaterally -Mild less than 39%.  Continue with statin and aspirin.  Blood pressure control excellent.  No changes made.  Aortic stenosis/regurgitation -Mild, murmur heard on exam.  No changes symptomatically.  Continue to monitor.  May consider checking echocardiogram next year.  Hyperlipidemia - Atorvastatin 40 mg LDL 56.  Excellent.  Seems to be doing quite well.  Excellent job.  1 year follow-up.   Medication Adjustments/Labs and Tests Ordered: Current medicines are reviewed at length with the patient today.  Concerns regarding medicines are outlined above.  Orders Placed This Encounter  Procedures  . EKG 12-Lead   No orders of the defined types were placed in this encounter.   Patient Instructions  Medication Instructions:  The current medical regimen is effective;  continue present plan and medications.  *If you need a refill on your cardiac medications before your next appointment, please call your pharmacy*  Follow-Up: At [facility], you and your health needs are our priority.  As part of our continuing mission to provide you with exceptional heart care, we have created designated Provider Care Teams.  These Care Teams include your primary Cardiologist (physician) and Advanced Practice Providers (APPs -  Physician Assistants and Nurse Practitioners) who all work together to provide you  with the care you need, when you need it.  Your next appointment:   12 months  The format for your next appointment:   In Person  Provider:   You may see Candee Furbish, MD or one of the following Advanced Practice Providers on your designated Care Team:    Truitt Merle, NP  Cecilie Kicks, NP  Kathyrn Drown, NP   Thank you for choosing [facility]!!        Signed, Candee Furbish, MD  09/07/2019 9:24 AM    [place]

## 2019-09-09 DIAGNOSIS — R3912 Poor urinary stream: Secondary | ICD-10-CM | POA: Diagnosis not present

## 2019-09-09 DIAGNOSIS — R351 Nocturia: Secondary | ICD-10-CM | POA: Diagnosis not present

## 2019-09-09 DIAGNOSIS — Z87442 Personal history of urinary calculi: Secondary | ICD-10-CM | POA: Diagnosis not present

## 2019-09-23 DIAGNOSIS — H1013 Acute atopic conjunctivitis, bilateral: Secondary | ICD-10-CM | POA: Diagnosis not present

## 2019-10-01 ENCOUNTER — Other Ambulatory Visit: Payer: Self-pay | Admitting: Cardiology

## 2019-10-06 DIAGNOSIS — R69 Illness, unspecified: Secondary | ICD-10-CM | POA: Diagnosis not present

## 2019-10-16 DIAGNOSIS — R6881 Early satiety: Secondary | ICD-10-CM | POA: Diagnosis not present

## 2019-10-16 DIAGNOSIS — R140 Abdominal distension (gaseous): Secondary | ICD-10-CM | POA: Diagnosis not present

## 2019-10-20 DIAGNOSIS — K219 Gastro-esophageal reflux disease without esophagitis: Secondary | ICD-10-CM | POA: Diagnosis not present

## 2019-11-01 DIAGNOSIS — K219 Gastro-esophageal reflux disease without esophagitis: Secondary | ICD-10-CM | POA: Diagnosis not present

## 2019-11-16 DIAGNOSIS — Z7982 Long term (current) use of aspirin: Secondary | ICD-10-CM | POA: Diagnosis not present

## 2019-11-16 DIAGNOSIS — E785 Hyperlipidemia, unspecified: Secondary | ICD-10-CM | POA: Diagnosis not present

## 2019-11-16 DIAGNOSIS — G4700 Insomnia, unspecified: Secondary | ICD-10-CM | POA: Diagnosis not present

## 2019-11-16 DIAGNOSIS — K219 Gastro-esophageal reflux disease without esophagitis: Secondary | ICD-10-CM | POA: Diagnosis not present

## 2019-11-16 DIAGNOSIS — H04129 Dry eye syndrome of unspecified lacrimal gland: Secondary | ICD-10-CM | POA: Diagnosis not present

## 2020-01-26 DIAGNOSIS — L821 Other seborrheic keratosis: Secondary | ICD-10-CM | POA: Diagnosis not present

## 2020-01-26 DIAGNOSIS — L578 Other skin changes due to chronic exposure to nonionizing radiation: Secondary | ICD-10-CM | POA: Diagnosis not present

## 2020-01-26 DIAGNOSIS — L570 Actinic keratosis: Secondary | ICD-10-CM | POA: Diagnosis not present

## 2020-03-08 DIAGNOSIS — R69 Illness, unspecified: Secondary | ICD-10-CM | POA: Diagnosis not present

## 2020-04-10 DIAGNOSIS — H5712 Ocular pain, left eye: Secondary | ICD-10-CM | POA: Diagnosis not present

## 2020-04-25 DIAGNOSIS — H04123 Dry eye syndrome of bilateral lacrimal glands: Secondary | ICD-10-CM | POA: Diagnosis not present

## 2020-04-25 DIAGNOSIS — H25813 Combined forms of age-related cataract, bilateral: Secondary | ICD-10-CM | POA: Diagnosis not present

## 2020-05-02 DIAGNOSIS — J302 Other seasonal allergic rhinitis: Secondary | ICD-10-CM | POA: Diagnosis not present

## 2020-05-02 DIAGNOSIS — H04129 Dry eye syndrome of unspecified lacrimal gland: Secondary | ICD-10-CM | POA: Diagnosis not present

## 2020-05-02 DIAGNOSIS — Z8249 Family history of ischemic heart disease and other diseases of the circulatory system: Secondary | ICD-10-CM | POA: Diagnosis not present

## 2020-05-02 DIAGNOSIS — R030 Elevated blood-pressure reading, without diagnosis of hypertension: Secondary | ICD-10-CM | POA: Diagnosis not present

## 2020-05-02 DIAGNOSIS — Z7982 Long term (current) use of aspirin: Secondary | ICD-10-CM | POA: Diagnosis not present

## 2020-05-02 DIAGNOSIS — E785 Hyperlipidemia, unspecified: Secondary | ICD-10-CM | POA: Diagnosis not present

## 2020-05-02 DIAGNOSIS — Z008 Encounter for other general examination: Secondary | ICD-10-CM | POA: Diagnosis not present

## 2020-07-12 DIAGNOSIS — H00019 Hordeolum externum unspecified eye, unspecified eyelid: Secondary | ICD-10-CM | POA: Diagnosis not present

## 2020-07-26 DIAGNOSIS — L570 Actinic keratosis: Secondary | ICD-10-CM | POA: Diagnosis not present

## 2020-07-26 DIAGNOSIS — L821 Other seborrheic keratosis: Secondary | ICD-10-CM | POA: Diagnosis not present

## 2020-07-26 DIAGNOSIS — L578 Other skin changes due to chronic exposure to nonionizing radiation: Secondary | ICD-10-CM | POA: Diagnosis not present

## 2020-07-27 DIAGNOSIS — Z79899 Other long term (current) drug therapy: Secondary | ICD-10-CM | POA: Diagnosis not present

## 2020-07-27 DIAGNOSIS — E7800 Pure hypercholesterolemia, unspecified: Secondary | ICD-10-CM | POA: Diagnosis not present

## 2020-08-29 DIAGNOSIS — R351 Nocturia: Secondary | ICD-10-CM | POA: Diagnosis not present

## 2020-08-29 DIAGNOSIS — Z23 Encounter for immunization: Secondary | ICD-10-CM | POA: Diagnosis not present

## 2020-08-29 DIAGNOSIS — Z79899 Other long term (current) drug therapy: Secondary | ICD-10-CM | POA: Diagnosis not present

## 2020-08-29 DIAGNOSIS — G479 Sleep disorder, unspecified: Secondary | ICD-10-CM | POA: Diagnosis not present

## 2020-08-29 DIAGNOSIS — E7800 Pure hypercholesterolemia, unspecified: Secondary | ICD-10-CM | POA: Diagnosis not present

## 2020-08-29 DIAGNOSIS — Z0000 Encounter for general adult medical examination without abnormal findings: Secondary | ICD-10-CM | POA: Diagnosis not present

## 2020-08-29 DIAGNOSIS — K219 Gastro-esophageal reflux disease without esophagitis: Secondary | ICD-10-CM | POA: Diagnosis not present

## 2020-09-07 ENCOUNTER — Encounter: Payer: Self-pay | Admitting: Cardiology

## 2020-09-07 ENCOUNTER — Other Ambulatory Visit: Payer: Self-pay

## 2020-09-07 ENCOUNTER — Ambulatory Visit: Payer: Medicare HMO | Admitting: Cardiology

## 2020-09-07 VITALS — BP 120/60 | HR 74 | Ht 66.5 in | Wt 130.0 lb

## 2020-09-07 DIAGNOSIS — I6523 Occlusion and stenosis of bilateral carotid arteries: Secondary | ICD-10-CM

## 2020-09-07 DIAGNOSIS — E7800 Pure hypercholesterolemia, unspecified: Secondary | ICD-10-CM

## 2020-09-07 DIAGNOSIS — R931 Abnormal findings on diagnostic imaging of heart and coronary circulation: Secondary | ICD-10-CM

## 2020-09-07 DIAGNOSIS — I351 Nonrheumatic aortic (valve) insufficiency: Secondary | ICD-10-CM

## 2020-09-07 NOTE — Progress Notes (Signed)
Cardiology Office Note:    Date:  09/07/2020   ID:  Peter Potts, DOB 06-12-1941, MRN [id number]  PCP:  Lawerance Cruel, MD  [facility] Cardiologist:  Candee Furbish, MD  [facility] Electrophysiologist:  None   Referring MD: Lawerance Cruel, MD    History of Present Illness:    Peter Potts is a 79 y.o. male here for the follow-up of elevated coronary calcium score greater than 400 with low risk nuclear stress test, aortic stenosis as well as regurgitation and mild carotid plaque bilaterally.  Doing quite well.  Stays quite active.  Swimmer.  He ran for many years.  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  Enjoys traveling.  [place]. Plays Pickle ball. Been feeling well without any anginal symptoms. No fevers chills nausea vomiting syncope bleeding.  Past Medical History:  Diagnosis Date  . Heart murmur   . Kidney stones     Past Surgical History:  Procedure Laterality Date  . COLON RESECTION  2011    Current Medications: Current Meds  Medication Sig  . aspirin 81 MG tablet Take 81 mg by mouth daily.  Marland Kitchen atorvastatin (LIPITOR) 40 MG tablet TAKE 1 TABLET BY MOUTH EVERY DAY  . cetirizine (ZYRTEC) 10 MG tablet Take 1 tablet (10 mg total) by mouth daily.  . Omega-3 Fatty Acids (FISH OIL PO) Take 2,400 mg by mouth daily.  Marland Kitchen zolpidem (AMBIEN) 10 MG tablet Take 10 mg by mouth at bedtime as needed.     Allergies:   Patient has no known allergies.   Social History   Socioeconomic History  . Marital status: Married    Spouse name: Not on file  . Number of children: Not on file  . Years of education: Not on file  . Highest education level: Not on file  Occupational History  . Not on file  Tobacco Use  . Smoking status: Never Smoker  . Smokeless tobacco: Never Used  Vaping Use  . Vaping Use: Never used  Substance and Sexual Activity  . Alcohol use: Yes    Alcohol/week: 1.0 - 2.0 standard drink    Types: 1 - 2 Glasses of wine per week  .  Drug use: Never  . Sexual activity: Not on file  Other Topics Concern  . Not on file  Social History Narrative  . Not on file   Social Determinants of Health   Financial Resource Strain:   . Difficulty of Paying Living Expenses: Not on file  Food Insecurity:   . Worried About Charity fundraiser in the Last Year: Not on file  . Ran Out of Food in the Last Year: Not on file  Transportation Needs:   . Lack of Transportation (Medical): Not on file  . Lack of Transportation (Non-Medical): Not on file  Physical Activity:   . Days of Exercise per Week: Not on file  . Minutes of Exercise per Session: Not on file  Stress:   . Feeling of Stress : Not on file  Social Connections:   . Frequency of Communication with Friends and Family: Not on file  . Frequency of Social Gatherings with Friends and Family: Not on file  . Attends Religious Services: Not on file  . Active Member of Clubs or Organizations: Not on file  . Attends Archivist Meetings: Not on file  . Marital Status: Not on file     Family History: The patient's family history includes Heart Problems  in his paternal grandfather.  ROS:   Please see the history of present illness.     All other systems reviewed and are negative.  EKGs/Labs/Other Studies Reviewed:    The following studies were reviewed today:  Calcium score 2018, CAD, calcific plaque -Ascending aorta 3.2 cm coronary score of 438.  61st percentile. -Continue with high intensity statin therapy. Excellent. Last LDL forty-eight, ALT sixteen normal liver function. No myalgias. Feels as though he may have some increased gas with statin therapy.  Nuclear stress test 2018 -No ischemia no transient ischemic dilatation  Echocardiogram 2018 -Mild LVH EF 60% -Mild aortic stenosis  Carotid Dopplers 2019 -Mild bilaterally. Continue with high intensity statin therapy. Excellent.  EKG:  EKG is  ordered today.  The ekg ordered today demonstrates sinus  rhythm 74 bpm no other abnormalities.  Recent Labs: No results found for requested labs within last 8760 hours.  Recent Lipid Panel    Component Value Date/Time   CHOL 129 09/19/2016 0756   TRIG 79 09/19/2016 0756   HDL 49 09/19/2016 0756   CHOLHDL 2.6 09/19/2016 0756   VLDL 16 09/19/2016 0756   LDLCALC 64 09/19/2016 0756     Risk Assessment/Calculations:       Physical Exam:    VS:  BP 120/60   Pulse 74   Ht 5' 6.5" (1.689 m)   Wt 130 lb (59 kg)   SpO2 98%   BMI 20.67 kg/m     Wt Readings from Last 3 Encounters:  09/07/20 130 lb (59 kg)  09/07/19 131 lb (59.4 kg)  11/03/18 130 lb (59 kg)     GEN: Thin but stable weight, well developed in no acute distress HEENT: Normal NECK: No JVD; No carotid bruits LYMPHATICS: No lymphadenopathy CARDIAC: RRR, 2/6 systolic murmur, no rubs, gallops RESPIRATORY:  Clear to auscultation without rales, wheezing or rhonchi  ABDOMEN: Soft, non-tender, non-distended MUSCULOSKELETAL:  No edema; No deformity  SKIN: Warm and dry NEUROLOGIC:  Alert and oriented x 3 PSYCHIATRIC:  Normal affect   ASSESSMENT:    1. Agatston coronary artery calcium score greater than 400   2. Hypercholesterolemia   3. Nonrheumatic aortic valve insufficiency   4. Aortic valve insufficiency, etiology of cardiac valve disease unspecified   5. Atherosclerosis of both carotid arteries    PLAN:    In order of problems listed above:  Coronary atherosclerosis, calcified plaque, coronary artery calcification -420 calcium score, nuclear stress test low risk with no ischemia.  Continue with aggressive risk factor modification.  Low-dose aspirin, high intensity statin.  On atorvastatin 40 mg.  Last check LDL was 48.  No myalgias.  Creatinine 1.04 hemoglobin 15.3 ALT 16.  Bilateral carotid artery plaque -Statin therapy, aspirin.  Less than 39%.  Aortic stenosis/regurgitation -Mild.  Murmur apparent.  We will repeat echocardiogram.  Hyperlipidemia -On  atorvastatin 40 mg.  Prior outside labs showed LDL of 56.  No myalgias.  Continue with current medical management.   Medication Adjustments/Labs and Tests Ordered: Current medicines are reviewed at length with the patient today.  Concerns regarding medicines are outlined above.  Orders Placed This Encounter  Procedures  . EKG 12-Lead  . ECHOCARDIOGRAM COMPLETE   No orders of the defined types were placed in this encounter.   Patient Instructions  Medication Instructions:  The current medical regimen is effective;  continue present plan and medications.  *If you need a refill on your cardiac medications before your next appointment, please call your pharmacy*  Testing/Procedures:  Your physician has requested that you have an echocardiogram. Echocardiography is a painless test that uses sound waves to create images of your heart. It provides your doctor with information about the size and shape of your heart and how well your heart's chambers and valves are working. This procedure takes approximately one hour. There are no restrictions for this procedure.  Follow-Up: At [facility], you and your health needs are our priority.  As part of our continuing mission to provide you with exceptional heart care, we have created designated Provider Care Teams.  These Care Teams include your primary Cardiologist (physician) and Advanced Practice Providers (APPs -  Physician Assistants and Nurse Practitioners) who all work together to provide you with the care you need, when you need it.  We recommend signing up for the patient portal called "MyChart".  Sign up information is provided on this After Visit Summary.  MyChart is used to connect with patients for Virtual Visits (Telemedicine).  Patients are able to view lab/test results, encounter notes, upcoming appointments, etc.  Non-urgent messages can be sent to your provider as well.   To learn more about what you can do with MyChart, go to  [email or web address].    Your next appointment:   12 month(s)  The format for your next appointment:   In Person  Provider:   Candee Furbish, MD  Thank you for choosing [facility]!!        Signed, Candee Furbish, MD  09/07/2020 10:40 AM    [place]

## 2020-09-07 NOTE — Patient Instructions (Signed)
Medication Instructions:  The current medical regimen is effective;  continue present plan and medications.  *If you need a refill on your cardiac medications before your next appointment, please call your pharmacy*  Testing/Procedures: Your physician has requested that you have an echocardiogram. Echocardiography is a painless test that uses sound waves to create images of your heart. It provides your doctor with information about the size and shape of your heart and how well your heart's chambers and valves are working. This procedure takes approximately one hour. There are no restrictions for this procedure.  Follow-Up: At [facility], you and your health needs are our priority.  As part of our continuing mission to provide you with exceptional heart care, we have created designated Provider Care Teams.  These Care Teams include your primary Cardiologist (physician) and Advanced Practice Providers (APPs -  Physician Assistants and Nurse Practitioners) who all work together to provide you with the care you need, when you need it.  We recommend signing up for the patient portal called "MyChart".  Sign up information is provided on this After Visit Summary.  MyChart is used to connect with patients for Virtual Visits (Telemedicine).  Patients are able to view lab/test results, encounter notes, upcoming appointments, etc.  Non-urgent messages can be sent to your provider as well.   To learn more about what you can do with MyChart, go to [email or web address].    Your next appointment:   12 month(s)  The format for your next appointment:   In Person  Provider:   Mark Skains, MD   Thank you for choosing  [facility]!!      

## 2020-09-26 ENCOUNTER — Ambulatory Visit (HOSPITAL_COMMUNITY): Payer: Medicare HMO | Attending: Cardiology

## 2020-09-26 ENCOUNTER — Other Ambulatory Visit: Payer: Self-pay

## 2020-09-26 DIAGNOSIS — I351 Nonrheumatic aortic (valve) insufficiency: Secondary | ICD-10-CM | POA: Diagnosis present

## 2020-09-26 LAB — ECHOCARDIOGRAM COMPLETE
AR max vel: 1.04 cm2
AV Area VTI: 1.01 cm2
AV Area mean vel: 0.91 cm2
AV Mean grad: 17 mmHg
AV Peak grad: 27.2 mmHg
Ao pk vel: 2.61 m/s
Area-P 1/2: 2.71 cm2
P 1/2 time: 458 msec
S' Lateral: 2.4 cm

## 2020-10-05 ENCOUNTER — Other Ambulatory Visit: Payer: Self-pay | Admitting: Cardiology

## 2020-11-27 ENCOUNTER — Encounter: Payer: Self-pay | Admitting: Gastroenterology

## 2020-11-28 ENCOUNTER — Encounter: Payer: Self-pay | Admitting: Gastroenterology

## 2020-11-29 DIAGNOSIS — L570 Actinic keratosis: Secondary | ICD-10-CM | POA: Diagnosis not present

## 2020-11-29 DIAGNOSIS — L821 Other seborrheic keratosis: Secondary | ICD-10-CM | POA: Diagnosis not present

## 2020-11-29 DIAGNOSIS — L578 Other skin changes due to chronic exposure to nonionizing radiation: Secondary | ICD-10-CM | POA: Diagnosis not present

## 2020-12-13 ENCOUNTER — Encounter: Payer: Self-pay | Admitting: Gastroenterology

## 2020-12-13 ENCOUNTER — Ambulatory Visit: Payer: Medicare HMO | Admitting: Gastroenterology

## 2020-12-13 VITALS — BP 118/62 | HR 79 | Ht 67.0 in | Wt 131.5 lb

## 2020-12-13 DIAGNOSIS — K219 Gastro-esophageal reflux disease without esophagitis: Secondary | ICD-10-CM | POA: Diagnosis not present

## 2020-12-13 DIAGNOSIS — R1310 Dysphagia, unspecified: Secondary | ICD-10-CM | POA: Diagnosis not present

## 2020-12-13 MED ORDER — OMEPRAZOLE 20 MG PO CPDR: 20.0000 mg | DELAYED_RELEASE_CAPSULE | Freq: Every day | ORAL | 11 refills | Status: DC

## 2020-12-13 NOTE — Progress Notes (Signed)
Chief Complaint: GERD  Referring Provider:  Lawerance Cruel, MD      ASSESSMENT AND PLAN;   #1. GERD with eso dysphagia with postprandial cough  #2. H/O diverticulitis s/p sig resection 2011. Last colon 10/2014 neg except for mild neo-sigmoid diverticulosis.  Plan: -Omeprazole 20mg  po qd #30, 11 refills.  Take half an hour before breakfast. -EGD with dil. Feb 1, 7:30 AM Tuesday.  I have discussed risks and benefits. -Chew food well and eat slowly.   HPI:    Peter Potts is a 80 y.o. male  With HLD, ascending aorta aneurysm 3.2 cm, aortic insufficiency, Nl EF Nl 60%  With occ intermittent solid food dysphagia x 1 year, lower neck.  At times has to wash it with liquids.  He also has occasional nausea.  No significant heartburn.  Has postprandial cough.  Seen by Dr. Neldon Mc, diagnosed with gastroesophageal reflux.  He has been sent to GI clinic for further evaluation  He denies having any significant abdominal pain, fever chills or night sweats.  No diarrhea or constipation.  No weight loss.  Brother with Barretts.  Past GI work-up: Colonoscopy 10/28/2014 -Mild neo-sigmoid diverticulosis -S/P sigmoid resection. -Otherwise normal to TI.   Past Medical History:  Diagnosis Date  . CAD (coronary artery disease)   . GERD (gastroesophageal reflux disease)   . Heart murmur   . Kidney stones     Past Surgical History:  Procedure Laterality Date  . COLON RESECTION  2011   Dr Merleen Milliner resection - (22 x 3.5 cm), mesentery (6.0 x 1.5cm) diverticulitis  . COLONOSCOPY  10/28/2014   Mild neosigmoid diverticulosis. Internal hemorrhoids. Otherwise normal colonoscopy to terminal ileum    Family History  Problem Relation Age of Onset  . Heart Problems Paternal Grandfather   . Colon cancer Neg Hx   . Esophageal cancer Neg Hx     Social History   Tobacco Use  . Smoking status: Never Smoker  . Smokeless tobacco: Never Used  Vaping Use  . Vaping Use:  Never used  Substance Use Topics  . Alcohol use: Yes    Alcohol/week: 1.0 - 2.0 standard drink    Types: 1 - 2 Glasses of wine per week  . Drug use: Never    Current Outpatient Medications  Medication Sig Dispense Refill  . aspirin 81 MG tablet Take 81 mg by mouth daily.    Marland Kitchen atorvastatin (LIPITOR) 40 MG tablet TAKE 1 TABLET BY MOUTH EVERY DAY 90 tablet 3  . cetirizine (ZYRTEC) 10 MG tablet Take 1 tablet (10 mg total) by mouth daily. 90 tablet 2  . Cholecalciferol (VITAMIN D3 PO) Take by mouth.    . Multiple Vitamins-Minerals (ZINC PO) Take by mouth.    . Omega-3 Fatty Acids (FISH OIL PO) Take 2,400 mg by mouth daily.    . Probiotic Product (PROBIOTIC PO) Take by mouth.    . zolpidem (AMBIEN) 10 MG tablet Take 10 mg by mouth as needed (on vacations).  0   No current facility-administered medications for this visit.    No Known Allergies  Review of Systems:  Constitutional: Denies fever, chills, diaphoresis, appetite change and fatigue.  HEENT: Has allergies Respiratory: Denies SOB, DOE, cough, chest tightness,  and wheezing.   Cardiovascular: Denies chest pain, palpitations and leg swelling.  Genitourinary: Denies dysuria, urgency, frequency, hematuria, flank pain and difficulty urinating.  Musculoskeletal: Denies myalgias, back pain, joint swelling, arthralgias and gait problem.  Skin: No rash.  Neurological: Denies dizziness, seizures, syncope, weakness, light-headedness, numbness and headaches.  Hematological: Denies adenopathy. Easy bruising, personal or family bleeding history  Psychiatric/Behavioral: No anxiety or depression.  Has insomnia     Physical Exam:    BP 118/62   Pulse 79   Ht 5\' 7"  (1.702 m)   Wt 131 lb 8 oz (59.6 kg)   BMI 20.60 kg/m  Wt Readings from Last 3 Encounters:  12/13/20 131 lb 8 oz (59.6 kg)  09/07/20 130 lb (59 kg)  09/07/19 131 lb (59.4 kg)   Constitutional:  Well-developed, in no acute distress. Psychiatric: Normal mood and affect.  Behavior is normal. HEENT: Pupils normal.  Conjunctivae are normal. No scleral icterus. Neck supple.  Cardiovascular: Normal rate, regular rhythm. No edema Pulmonary/chest: Effort normal and breath sounds normal. No wheezing, rales or rhonchi. Abdominal: Soft, nondistended. Nontender. Bowel sounds active throughout. There are no masses palpable. No hepatomegaly. Rectal: Deferred Neurological: Alert and oriented to person place and time. Skin: Skin is warm and dry. No rashes noted.  Data Reviewed: I have personally reviewed following labs and imaging studies  CBC: No flowsheet data found.  CMP: CMP Latest Ref Rng & Units 09/19/2016  ALT 9 - 46 U/L 12      Carmell Austria, MD 12/13/2020, 8:58 AM  Cc: Lawerance Cruel, MD

## 2020-12-13 NOTE — Patient Instructions (Addendum)
If you are age 79 or older, your body mass index should be between 23-30. Your Body mass index is 20.6 kg/m. If this is out of the aforementioned range listed, please consider follow up with your Primary Care Provider.  If you are age 10 or younger, your body mass index should be between 19-25. Your Body mass index is 20.6 kg/m. If this is out of the aformentioned range listed, please consider follow up with your Primary Care Provider.   We have sent the following medications to your pharmacy for you to pick up at your convenience: Omeprazole  You have been scheduled for an endoscopy. Please follow written instructions given to you at your visit today. If you use inhalers (even only as needed), please bring them with you on the day of your procedure.  Thank you,  Dr. Jackquline Denmark

## 2020-12-14 ENCOUNTER — Encounter: Payer: Self-pay | Admitting: Gastroenterology

## 2020-12-19 ENCOUNTER — Ambulatory Visit (AMBULATORY_SURGERY_CENTER): Payer: Medicare HMO | Admitting: Gastroenterology

## 2020-12-19 ENCOUNTER — Encounter: Payer: Self-pay | Admitting: Gastroenterology

## 2020-12-19 ENCOUNTER — Other Ambulatory Visit: Payer: Self-pay

## 2020-12-19 VITALS — BP 116/62 | HR 68 | Temp 97.3°F | Resp 10 | Ht 67.0 in | Wt 131.0 lb

## 2020-12-19 DIAGNOSIS — R1310 Dysphagia, unspecified: Secondary | ICD-10-CM

## 2020-12-19 DIAGNOSIS — K2970 Gastritis, unspecified, without bleeding: Secondary | ICD-10-CM | POA: Diagnosis not present

## 2020-12-19 DIAGNOSIS — K449 Diaphragmatic hernia without obstruction or gangrene: Secondary | ICD-10-CM

## 2020-12-19 DIAGNOSIS — K2950 Unspecified chronic gastritis without bleeding: Secondary | ICD-10-CM | POA: Diagnosis not present

## 2020-12-19 DIAGNOSIS — K219 Gastro-esophageal reflux disease without esophagitis: Secondary | ICD-10-CM | POA: Diagnosis not present

## 2020-12-19 DIAGNOSIS — K2289 Other specified disease of esophagus: Secondary | ICD-10-CM | POA: Diagnosis not present

## 2020-12-19 MED ORDER — SODIUM CHLORIDE 0.9 % IV SOLN: 500.0000 mL | Freq: Once | INTRAVENOUS | Status: DC

## 2020-12-19 NOTE — Op Note (Signed)
[facility] Patient Name: Peter Potts Procedure Date: 12/19/2020 7:19 AM MRN: [id number] Endoscopist: Jackquline Denmark , MD Age: 80 Referring MD:  Date of Birth: [birth year] Gender: Male Account #: [id number] Procedure:                Upper GI endoscopy Indications:              Dysphagia Medicines:                Monitored Anesthesia Care Procedure:                Pre-Anesthesia Assessment:                           - Prior to the procedure, a History and Physical                            was performed, and patient medications and                            allergies were reviewed. The patient's tolerance of                            previous anesthesia was also reviewed. The risks                            and benefits of the procedure and the sedation                            options and risks were discussed with the patient.                            All questions were answered, and informed consent                            was obtained. Prior Anticoagulants: The patient has                            taken no previous anticoagulant or antiplatelet                            agents. ASA Grade Assessment: II - A patient with                            mild systemic disease. After reviewing the risks                            and benefits, the patient was deemed in                            satisfactory condition to undergo the procedure.                           After obtaining informed consent, the endoscope was  passed under direct vision. Throughout the                            procedure, the patient's blood pressure, pulse, and                            oxygen saturations were monitored continuously. The                            Endoscope was introduced through the mouth, and                            advanced to the second part of duodenum. The upper                            GI endoscopy was accomplished without difficulty.                             The patient tolerated the procedure well. Scope In: Scope Out: Findings:                 The examined esophagus was mildly torturous but                            normal. Z-line well-defined at 40 cm. No Barrett's                            esophagus. Biopsies were obtained from the proximal                            and distal esophagus with cold forceps for                            histology of suspected eosinophilic esophagitis.                            The scope was withdrawn. Dilation was performed                            with a Maloney dilator with mild resistance at 50                            Fr.                           A small hiatal hernia was present.                           Localized mild inflammation characterized by                            erythema was found in the gastric body and in the                            gastric antrum.  Biopsies were taken with a cold                            forceps for histology.                           The examined duodenum was normal. Complications:            No immediate complications. Estimated Blood Loss:     Estimated blood loss: none. Impression:               -Mild presbyesophagus s/p esophageal dilatation.                           -Small hiatal hernia.                           -Mild gastritis. Biopsied. Recommendation:           - Patient has a contact number available for                            emergencies. The signs and symptoms of potential                            delayed complications were discussed with the                            patient. Return to normal activities tomorrow.                            Written discharge instructions were provided to the                            patient.                           - Post dilatation diet.                           - Continue present medications including omeprazole                            20 mg p.o. once a day.                            - Await pathology results.                           - The findings and recommendations were discussed                            with the patient's family. Jackquline Denmark, MD 12/19/2020 7:58:05 AM This report has been signed electronically.

## 2020-12-19 NOTE — Patient Instructions (Addendum)
Continue all of your medications today.  Read all of the handouts given to you by your recovery room nurse.     YOU SHOULD EXPECT: Some feelings of bloating in the abdomen. Passage of more gas than usual.  Walking can help get rid of the air that was put into your GI tract during the procedure and reduce the bloating.   Please Note:  You might notice some irritation and congestion in your nose or some drainage.  This is from the oxygen used during your procedure.  There is no need for concern and it should clear up in a day or so.  SYMPTOMS TO REPORT IMMEDIATELY:    Following upper endoscopy (EGD)  Vomiting of blood or coffee ground material  New chest pain or pain under the shoulder blades  Painful or persistently difficult swallowing  New shortness of breath  Fever of 100F or higher  Black, tarry-looking stools  For urgent or emergent issues, a gastroenterologist can be reached at any hour by calling [phone]. Do not use MyChart messaging for urgent concerns.    DIET:  We do recommend nothing by mouth until 0900. From 0900-1000, you may have clear liquids.  Soft foods are suggested for the rest of today. Then you may proceed to your regular diet tomorrow.  Drink plenty of fluids but you should avoid alcoholic beverages for 24 hours.  ACTIVITY:  You should plan to take it easy for the rest of today and you should NOT DRIVE or use heavy machinery until tomorrow (because of the sedation medicines used during the test).    FOLLOW UP: Our staff will call the number listed on your records 48-72 hours following your procedure to check on you and address any questions or concerns that you may have regarding the information given to you following your procedure. If we do not reach you, we will leave a message.  We will attempt to reach you two times.  During this call, we will ask if you have developed any symptoms of COVID 19. If you develop any symptoms (ie: fever, flu-like symptoms,  shortness of breath, cough etc.) before then, please call [phone].  If you test positive for Covid 19 in the 2 weeks post procedure, please call and report this information to [place].    If any biopsies were taken you will be contacted by phone or by letter within the next 1-3 weeks.  Please call us at [phone] if you have not heard about the biopsies in 3 weeks.    SIGNATURES/CONFIDENTIALITY: You and/or your care partner have signed paperwork which will be entered into your electronic medical record.  These signatures attest to the fact that that the information above on your After Visit Summary has been reviewed and is understood.  Full responsibility of the confidentiality of this discharge information lies with you and/or your care-partner.

## 2020-12-19 NOTE — Progress Notes (Signed)
PT taken to PACU. Monitors in place. VSS. Report given to RN. 

## 2020-12-21 ENCOUNTER — Telehealth: Payer: Self-pay

## 2020-12-21 ENCOUNTER — Telehealth: Payer: Self-pay | Admitting: *Deleted

## 2020-12-21 NOTE — Telephone Encounter (Signed)
Left message on answering machine. 

## 2020-12-21 NOTE — Telephone Encounter (Signed)
1. Have you developed a fever since your procedure? no  2.   Have you had an respiratory symptoms (SOB or cough) since your procedure? no  3.   Have you tested positive for COVID 19 since your procedure no  4.   Have you had any family members/close contacts diagnosed with the COVID 19 since your procedure?  no   If yes to any of these questions please route to Joylene John, RN and Joella Prince, RN Follow up Call-  Call back number 12/19/2020  Post procedure Call Back phone  # [phone]  Permission to leave phone message Yes  Some recent data might be hidden     Patient questions:  Do you have a fever, pain , or abdominal swelling? No. Pain Score  0 *  Have you tolerated food without any problems? Yes.    Have you been able to return to your normal activities? Yes.    Do you have any questions about your discharge instructions: Diet   No. Medications  No. Follow up visit  No.  Do you have questions or concerns about your Care? No.  Actions: * If pain score is 4 or above: No action needed, pain <4.

## 2020-12-26 ENCOUNTER — Encounter: Payer: Self-pay | Admitting: Gastroenterology

## 2021-01-01 ENCOUNTER — Telehealth: Payer: Self-pay | Admitting: *Deleted

## 2021-01-01 NOTE — Telephone Encounter (Signed)
Chart entered in error

## 2021-01-19 DIAGNOSIS — K219 Gastro-esophageal reflux disease without esophagitis: Secondary | ICD-10-CM | POA: Diagnosis not present

## 2021-01-19 DIAGNOSIS — H00011 Hordeolum externum right upper eyelid: Secondary | ICD-10-CM | POA: Diagnosis not present

## 2021-02-13 DIAGNOSIS — Z7982 Long term (current) use of aspirin: Secondary | ICD-10-CM | POA: Diagnosis not present

## 2021-02-13 DIAGNOSIS — R030 Elevated blood-pressure reading, without diagnosis of hypertension: Secondary | ICD-10-CM | POA: Diagnosis not present

## 2021-02-13 DIAGNOSIS — J302 Other seasonal allergic rhinitis: Secondary | ICD-10-CM | POA: Diagnosis not present

## 2021-02-13 DIAGNOSIS — G4700 Insomnia, unspecified: Secondary | ICD-10-CM | POA: Diagnosis not present

## 2021-02-13 DIAGNOSIS — E785 Hyperlipidemia, unspecified: Secondary | ICD-10-CM | POA: Diagnosis not present

## 2021-02-13 DIAGNOSIS — K219 Gastro-esophageal reflux disease without esophagitis: Secondary | ICD-10-CM | POA: Diagnosis not present

## 2021-02-13 DIAGNOSIS — Z8249 Family history of ischemic heart disease and other diseases of the circulatory system: Secondary | ICD-10-CM | POA: Diagnosis not present

## 2021-02-18 ENCOUNTER — Other Ambulatory Visit: Payer: Self-pay

## 2021-02-18 ENCOUNTER — Emergency Department (HOSPITAL_BASED_OUTPATIENT_CLINIC_OR_DEPARTMENT_OTHER)
Admission: EM | Admit: 2021-02-18 | Discharge: 2021-02-18 | Disposition: A | Discharge status: Home / Self Care | Payer: Medicare HMO | Attending: Emergency Medicine | Admitting: Emergency Medicine

## 2021-02-18 ENCOUNTER — Encounter (HOSPITAL_BASED_OUTPATIENT_CLINIC_OR_DEPARTMENT_OTHER): Payer: Self-pay | Admitting: *Deleted

## 2021-02-18 DIAGNOSIS — T2662XA Corrosion of cornea and conjunctival sac, left eye, initial encounter: Secondary | ICD-10-CM | POA: Diagnosis not present

## 2021-02-18 DIAGNOSIS — T65891A Toxic effect of other specified substances, accidental (unintentional), initial encounter: Secondary | ICD-10-CM | POA: Insufficient documentation

## 2021-02-18 DIAGNOSIS — Z79899 Other long term (current) drug therapy: Secondary | ICD-10-CM | POA: Diagnosis not present

## 2021-02-18 DIAGNOSIS — I2510 Atherosclerotic heart disease of native coronary artery without angina pectoris: Secondary | ICD-10-CM | POA: Diagnosis not present

## 2021-02-18 DIAGNOSIS — H10212 Acute toxic conjunctivitis, left eye: Secondary | ICD-10-CM | POA: Insufficient documentation

## 2021-02-18 DIAGNOSIS — Z7982 Long term (current) use of aspirin: Secondary | ICD-10-CM | POA: Diagnosis not present

## 2021-02-18 DIAGNOSIS — H5789 Other specified disorders of eye and adnexa: Secondary | ICD-10-CM | POA: Diagnosis not present

## 2021-02-18 NOTE — ED Triage Notes (Signed)
Pt reports that he switched up his eye drops and his fever blister medication last night. Pt placed fever blister medication into left eye. Vision unchanged.

## 2021-02-18 NOTE — Discharge Instructions (Signed)
1.  At this time your eye irritation is likely to improve without specific treatment.  You can use over-the-counter medications such as Lacri-Lube or Naphazoline type products. 2. See your eye doctor for recheck in 2-3 days if symptoms not significantly improved.

## 2021-02-18 NOTE — ED Provider Notes (Signed)
[facility] EMERGENCY DEPARTMENT Provider Note   CSN: [id number] Arrival date & time: 02/18/21  1711     History Chief Complaint  Patient presents with  . Eye Pain    Peter Potts is a 80 y.o. male.  HPI Patient typically puts Systane eyedrops in his eyes at night.  Last night he confused his drops, and accidentally put his camphor fever blister drop in the left eye.  He reports he did flush his eye out.  This morning however continues to be red and irritated, especially the lower lid.  No visual impairment or change.  No significant drainage.      Past Medical History:  Diagnosis Date  . CAD (coronary artery disease)   . GERD (gastroesophageal reflux disease)   . Heart murmur   . Kidney stones     Patient Active Problem List   Diagnosis Date Noted  . Aortic regurgitation 06/18/2016  . Carotid artery plaque 06/18/2016  . Other allergic rhinitis 11/21/2015    Past Surgical History:  Procedure Laterality Date  . COLON RESECTION  2011   Dr Merleen Milliner resection - (22 x 3.5 cm), mesentery (6.0 x 1.5cm) diverticulitis  . COLONOSCOPY  10/28/2014   Mild neosigmoid diverticulosis. Internal hemorrhoids. Otherwise normal colonoscopy to terminal ileum       Family History  Problem Relation Age of Onset  . Heart Problems Paternal Grandfather   . Colon cancer Neg Hx   . Esophageal cancer Neg Hx     Social History   Tobacco Use  . Smoking status: Never Smoker  . Smokeless tobacco: Never Used  Vaping Use  . Vaping Use: Never used  Substance Use Topics  . Alcohol use: Yes    Alcohol/week: 1.0 - 2.0 standard drink    Types: 1 - 2 Glasses of wine per week  . Drug use: Never    Home Medications Prior to Admission medications   Medication Sig Start Date End Date Taking? Authorizing Provider  aspirin 81 MG tablet Take 81 mg by mouth daily.    [provider]  atorvastatin (LIPITOR) 40 MG tablet TAKE 1 TABLET BY MOUTH EVERY DAY  10/05/20   Jerline Pain, MD  cetirizine (ZYRTEC) 10 MG tablet Take 1 tablet (10 mg total) by mouth daily. 11/03/18   Kozlow, Donnamarie Poag, MD  Cholecalciferol (VITAMIN D3 PO) Take by mouth.    [provider]  Multiple Vitamins-Minerals (ZINC PO) Take by mouth.    [provider]  Omega-3 Fatty Acids (FISH OIL PO) Take 2,400 mg by mouth daily.    [provider]  omeprazole (PRILOSEC) 20 MG capsule Take 1 capsule (20 mg total) by mouth daily. 12/13/20   Jackquline Denmark, MD  Probiotic Product (PROBIOTIC PO) Take by mouth.    [provider]  zolpidem (AMBIEN) 10 MG tablet Take 10 mg by mouth as needed (on vacations). 07/08/17   [provider]    Allergies    Patient has no known allergies.  Review of Systems   Review of Systems Constitutional: No fever chills or general illness. Physical Exam Updated Vital Signs BP (!) 152/71 (BP Location: Left Arm)   Pulse 74   Temp 98.3 F (36.8 C) (Oral)   Resp 18   Ht 5\' 6"  (1.676 m)   Wt 59 kg   SpO2 100%   BMI 20.98 kg/m   Physical Exam Constitutional:      Comments: Alert and nontoxic.  Clinically well in  appearance  HENT:     Mouth/Throat:     Pharynx: Oropharynx is clear.  Eyes:     Comments: Mild injection of the left conjunctiva with some injection particularly of the lower palpebral conjunctiva.  Pupils are symmetric.  Extraocular motions normal.  Pulmonary:     Breath sounds: Normal breath sounds.  Skin:    General: Skin is warm and dry.  Neurological:     General: No focal deficit present.     Mental Status: He is oriented to person, place, and time.  Psychiatric:        Mood and Affect: Mood normal.       ED Results / Procedures / Treatments   Labs (all labs ordered are listed, but only abnormal results are displayed) Labs Reviewed - No data to display  EKG None  Radiology No results found.  Procedures Procedures   Medications Ordered in ED Medications - No data to  display  ED Course  I have reviewed the triage vital signs and the nursing notes.  Pertinent labs & imaging results that were available during my care of the patient were reviewed by me and considered in my medical decision making (see chart for details).    MDM Rules/Calculators/A&P                          Patient is mild chemical conjunctivitis of the left eye from a camphor containing fever blister medication no signs of any significant injection or involvement of the cornea.  No visual changes.  At this time recommendations for symptomatic treatment with continuing soothing drops such as Lacri-Lube Systane or over-the-counter vasoconstrictors temporarily.  Patient is agree with plan.  If symptoms not significantly improved within 2 to 3 days will follow up with ophthalmology Final Clinical Impression(s) / ED Diagnoses Final diagnoses:  Chemical conjunctivitis of left eye    Rx / DC Orders ED Discharge Orders    None       Peter Shanks, MD 02/18/21 1755

## 2021-04-27 DIAGNOSIS — H25813 Combined forms of age-related cataract, bilateral: Secondary | ICD-10-CM | POA: Diagnosis not present

## 2021-04-27 DIAGNOSIS — H11151 Pinguecula, right eye: Secondary | ICD-10-CM | POA: Diagnosis not present

## 2021-05-02 DIAGNOSIS — L821 Other seborrheic keratosis: Secondary | ICD-10-CM | POA: Diagnosis not present

## 2021-05-02 DIAGNOSIS — L578 Other skin changes due to chronic exposure to nonionizing radiation: Secondary | ICD-10-CM | POA: Diagnosis not present

## 2021-05-02 DIAGNOSIS — L570 Actinic keratosis: Secondary | ICD-10-CM | POA: Diagnosis not present

## 2021-06-20 DIAGNOSIS — L570 Actinic keratosis: Secondary | ICD-10-CM | POA: Diagnosis not present

## 2021-06-21 DIAGNOSIS — Z20822 Contact with and (suspected) exposure to covid-19: Secondary | ICD-10-CM | POA: Diagnosis not present

## 2021-06-21 DIAGNOSIS — Z03818 Encounter for observation for suspected exposure to other biological agents ruled out: Secondary | ICD-10-CM | POA: Diagnosis not present

## 2021-08-15 DIAGNOSIS — I350 Nonrheumatic aortic (valve) stenosis: Secondary | ICD-10-CM | POA: Diagnosis not present

## 2021-08-15 DIAGNOSIS — Z9049 Acquired absence of other specified parts of digestive tract: Secondary | ICD-10-CM | POA: Diagnosis not present

## 2021-08-15 DIAGNOSIS — R110 Nausea: Secondary | ICD-10-CM | POA: Diagnosis not present

## 2021-08-15 DIAGNOSIS — J302 Other seasonal allergic rhinitis: Secondary | ICD-10-CM | POA: Diagnosis not present

## 2021-08-15 DIAGNOSIS — R109 Unspecified abdominal pain: Secondary | ICD-10-CM | POA: Diagnosis not present

## 2021-08-15 DIAGNOSIS — R1010 Upper abdominal pain, unspecified: Secondary | ICD-10-CM | POA: Diagnosis not present

## 2021-08-15 DIAGNOSIS — Z7982 Long term (current) use of aspirin: Secondary | ICD-10-CM | POA: Diagnosis not present

## 2021-08-15 DIAGNOSIS — R197 Diarrhea, unspecified: Secondary | ICD-10-CM | POA: Diagnosis not present

## 2021-08-15 DIAGNOSIS — K644 Residual hemorrhoidal skin tags: Secondary | ICD-10-CM | POA: Diagnosis not present

## 2021-08-15 DIAGNOSIS — E785 Hyperlipidemia, unspecified: Secondary | ICD-10-CM | POA: Diagnosis not present

## 2021-08-15 DIAGNOSIS — I6529 Occlusion and stenosis of unspecified carotid artery: Secondary | ICD-10-CM | POA: Diagnosis not present

## 2021-08-15 DIAGNOSIS — K5792 Diverticulitis of intestine, part unspecified, without perforation or abscess without bleeding: Secondary | ICD-10-CM | POA: Diagnosis not present

## 2021-08-21 DIAGNOSIS — H04123 Dry eye syndrome of bilateral lacrimal glands: Secondary | ICD-10-CM | POA: Diagnosis not present

## 2021-08-21 DIAGNOSIS — Z125 Encounter for screening for malignant neoplasm of prostate: Secondary | ICD-10-CM | POA: Diagnosis not present

## 2021-09-05 ENCOUNTER — Emergency Department (HOSPITAL_BASED_OUTPATIENT_CLINIC_OR_DEPARTMENT_OTHER)
Admission: EM | Admit: 2021-09-05 | Discharge: 2021-09-05 | Disposition: A | Discharge status: Home / Self Care | Payer: Medicare HMO | Attending: Emergency Medicine | Admitting: Emergency Medicine

## 2021-09-05 ENCOUNTER — Emergency Department (HOSPITAL_BASED_OUTPATIENT_CLINIC_OR_DEPARTMENT_OTHER): Payer: Medicare HMO

## 2021-09-05 ENCOUNTER — Other Ambulatory Visit: Payer: Self-pay

## 2021-09-05 ENCOUNTER — Encounter (HOSPITAL_BASED_OUTPATIENT_CLINIC_OR_DEPARTMENT_OTHER): Payer: Self-pay | Admitting: Emergency Medicine

## 2021-09-05 DIAGNOSIS — R0789 Other chest pain: Secondary | ICD-10-CM | POA: Diagnosis not present

## 2021-09-05 DIAGNOSIS — I2510 Atherosclerotic heart disease of native coronary artery without angina pectoris: Secondary | ICD-10-CM | POA: Insufficient documentation

## 2021-09-05 DIAGNOSIS — R110 Nausea: Secondary | ICD-10-CM | POA: Diagnosis not present

## 2021-09-05 DIAGNOSIS — R079 Chest pain, unspecified: Secondary | ICD-10-CM

## 2021-09-05 DIAGNOSIS — Z7982 Long term (current) use of aspirin: Secondary | ICD-10-CM | POA: Insufficient documentation

## 2021-09-05 LAB — COMPREHENSIVE METABOLIC PANEL
ALT: 19 U/L (ref 0–44)
AST: 31 U/L (ref 15–41)
Albumin: 3.7 g/dL (ref 3.5–5.0)
Alkaline Phosphatase: 93 U/L (ref 38–126)
Anion gap: 5 (ref 5–15)
BUN: 16 mg/dL (ref 8–23)
CO2: 27 mmol/L (ref 22–32)
Calcium: 9 mg/dL (ref 8.9–10.3)
Chloride: 106 mmol/L (ref 98–111)
Creatinine, Ser: 0.97 mg/dL (ref 0.61–1.24)
GFR, Estimated: >60 mL/min (ref >60)
Glucose, Bld: 94 mg/dL (ref 70–99)
Potassium: 4 mmol/L (ref 3.5–5.1)
Sodium: 138 mmol/L (ref 135–145)
Total Bilirubin: 1.2 mg/dL (ref 0.3–1.2)
Total Protein: 6.7 g/dL (ref 6.5–8.1)

## 2021-09-05 LAB — CBC
HCT: 45.1 % (ref 39.0–52.0)
Hemoglobin: 15.4 g/dL (ref 13.0–17.0)
MCH: 31.4 pg (ref 26.0–34.0)
MCHC: 34.1 g/dL (ref 30.0–36.0)
MCV: 91.9 fL (ref 80.0–100.0)
Platelets: 160 10*3/uL (ref 150–400)
RBC: 4.91 MIL/uL (ref 4.22–5.81)
RDW: 13.1 % (ref 11.5–15.5)
WBC: 6 10*3/uL (ref 4.0–10.5)
nRBC: 0 % (ref 0.0–0.2)

## 2021-09-05 LAB — LIPASE, BLOOD: Lipase: 36 U/L (ref 11–51)

## 2021-09-05 LAB — TROPONIN I (HIGH SENSITIVITY)
Troponin I (High Sensitivity): 4 ng/L (ref <18)
Troponin I (High Sensitivity): 4 ng/L (ref <18)

## 2021-09-05 NOTE — Discharge Instructions (Signed)
Follow up with your cardiologist.  Return for worsening symptoms.  

## 2021-09-05 NOTE — ED Triage Notes (Signed)
Pt states for the past two weeks he has felt like he may have a twinge of vertigo with a little nausea  Pt states yesterday he started having a little pain in the center of his chest he says is intermittent  Pt states he just feels off so he thought he would come in and get checked out

## 2021-09-05 NOTE — ED Provider Notes (Addendum)
[facility] EMERGENCY DEPARTMENT Provider Note  CSN: [id number] Arrival date & time: 09/05/21 3546  Chief Complaint(s) No chief complaint on file.  HPI Peter Potts is a 80 y.o. male with a past medical history listed below who presents to the emergency department with 2 weeks of intermittent lightheadedness and balance issues.  Typically last several minutes to 30 minutes at a time.  May occur once or several times per day.  Completely resolve without intervention.  No triggers.  Can occur spontaneous. Reports that he noted some improvement after performing Epley maneuvers.  Additionally for the past 2 days patient has been having intermittent left-sided chest discomfort described as "twinges."  These last less than a second at a time.  Can occur several times in a few minutes, or can be several hours in between.  They are nonradiating and nonexertional.  They are not associated with any shortness of breath.  No nausea or vomiting.  No abdominal pain.    Patient is very active and plays pickle ball several times a week, walks 40 minutes a day, and does light weight training several days a week.  He has a history of CAD with a CT coronary score greater than 400 that was followed by a negative stress test.  He is followed by cardiology.  Recent echo revealed mildly worsening of his aortic stenosis initially mild now mild to moderate.  HPI  Past Medical History Past Medical History:  Diagnosis Date   CAD (coronary artery disease)    GERD (gastroesophageal reflux disease)    Heart murmur    Kidney stones    Patient Active Problem List   Diagnosis Date Noted   Aortic regurgitation 06/18/2016   Carotid artery plaque 06/18/2016   Other allergic rhinitis 11/21/2015   Home Medication(s) Prior to Admission medications   Medication Sig Start Date End Date Taking? Authorizing Provider  aspirin 81 MG tablet Take 81 mg by mouth daily.    [provider]   atorvastatin (LIPITOR) 40 MG tablet TAKE 1 TABLET BY MOUTH EVERY DAY 10/05/20   Jerline Pain, MD  cetirizine (ZYRTEC) 10 MG tablet Take 1 tablet (10 mg total) by mouth daily. 11/03/18   Kozlow, Donnamarie Poag, MD  Cholecalciferol (VITAMIN D3 PO) Take by mouth.    [provider]  Multiple Vitamins-Minerals (ZINC PO) Take by mouth.    [provider]  Omega-3 Fatty Acids (FISH OIL PO) Take 2,400 mg by mouth daily.    [provider]  omeprazole (PRILOSEC) 20 MG capsule Take 1 capsule (20 mg total) by mouth daily. 12/13/20   Jackquline Denmark, MD  Probiotic Product (PROBIOTIC PO) Take by mouth.    [provider]  zolpidem (AMBIEN) 10 MG tablet Take 10 mg by mouth as needed (on vacations). 07/08/17   [provider]  Past Surgical History Past Surgical History:  Procedure Laterality Date   COLON RESECTION  2011   Dr Merleen Milliner resection - (22 x 3.5 cm), mesentery (6.0 x 1.5cm) diverticulitis   COLONOSCOPY  10/28/2014   Mild neosigmoid diverticulosis. Internal hemorrhoids. Otherwise normal colonoscopy to terminal ileum   Family History Family History  Problem Relation Age of Onset   Heart Problems Paternal Grandfather    Colon cancer Neg Hx    Esophageal cancer Neg Hx     Social History Social History   Tobacco Use   Smoking status: Never   Smokeless tobacco: Never  Vaping Use   Vaping Use: Never used  Substance Use Topics   Alcohol use: Yes    Alcohol/week: 1.0 - 2.0 standard drink    Types: 1 - 2 Glasses of wine per week   Drug use: Never   Allergies Patient has no known allergies.  Review of Systems Review of Systems All other systems are reviewed and are negative for acute change except as noted in the HPI  Physical Exam Vital Signs  I have reviewed the triage vital signs BP 136/71   Pulse 77    Temp 98.1 F (36.7 C) (Oral)   Resp 16   Ht 5\' 7"  (1.702 m)   Wt 59 kg   SpO2 99%   BMI 20.36 kg/m   Physical Exam Vitals reviewed.  Constitutional:      General: He is not in acute distress.    Appearance: He is well-developed. He is not diaphoretic.  HENT:     Head: Normocephalic and atraumatic.     Nose: Nose normal.  Eyes:     General: No scleral icterus.       Right eye: No discharge.        Left eye: No discharge.     Conjunctiva/sclera: Conjunctivae normal.     Pupils: Pupils are equal, round, and reactive to light.  Cardiovascular:     Rate and Rhythm: Normal rate and regular rhythm.     Heart sounds: No murmur heard.   No friction rub. No gallop.  Pulmonary:     Effort: Pulmonary effort is normal. No respiratory distress.     Breath sounds: Normal breath sounds. No stridor. No rales.  Abdominal:     General: There is no distension.     Palpations: Abdomen is soft.     Tenderness: There is no abdominal tenderness.  Musculoskeletal:        General: No tenderness.     Cervical back: Normal range of motion and neck supple.  Skin:    General: Skin is warm and dry.     Findings: No erythema or rash.  Neurological:     Mental Status: He is alert and oriented to person, place, and time.     Comments: Mental Status:  Alert and oriented to person, place, and time.  Attention and concentration normal.  Speech clear.  Recent memory is intact  Cranial Nerves:  II Visual Fields: Intact to confrontation. Visual fields intact. Potts, IV, VI: Pupils equal and reactive to light and near. Full eye movement without nystagmus  V Facial Sensation: Normal. No weakness of masticatory muscles  VII: No facial weakness or asymmetry  VIII Auditory Acuity: Grossly normal  IX/X: The uvula is midline; the palate elevates symmetrically  XI: Normal sternocleidomastoid and trapezius strength  XII: The tongue is midline. No atrophy or fasciculations.   Motor System: Muscle Strength: 5/5  and symmetric in the  upper and lower extremities. No pronation or drift.  Muscle Tone: Tone and muscle bulk are normal in the upper and lower extremities.  Reflexes: DTRs: 1+ and symmetrical in all four extremities. No Clonus Coordination: Intact finger-to-nose, heel-to-shin. No tremor.  Sensation: Intact to light touch. Gait: Routine gait normal.   HINTS Plus: Nystagmus: lateral  Head impulse: abnormal Skew: normal Hearing: symmetric       ED Results and Treatments Labs (all labs ordered are listed, but only abnormal results are displayed) Labs Reviewed  CBC  COMPREHENSIVE METABOLIC PANEL  LIPASE, BLOOD  TROPONIN I (HIGH SENSITIVITY)                                                                                                                         EKG  EKG Interpretation  Date/Time:  Wednesday September 05 2021 06:24:13 EDT Ventricular Rate:  80 PR Interval:  152 QRS Duration: 96 QT Interval:  380 QTC Calculation: 439 R Axis:   0 Text Interpretation: Sinus rhythm Probable left atrial enlargement No significant change since last tracing Confirmed by Addison Lank [phone]) on 09/05/2021 6:30:25 AM       Radiology No results found.  Pertinent labs & imaging results that were available during my care of the patient were reviewed by me and considered in my medical decision making (see MDM for details).  Medications Ordered in ED Medications - No data to display                                                                                                                                   Procedures Procedures  (including critical care time)  Medical Decision Making / ED Course I have reviewed the nursing notes for this encounter and the patient's prior records (if available in EHR or on provided paperwork).  Peter Potts was evaluated in Emergency Department on 09/05/2021 for the symptoms described in the history of present illness. He was evaluated in  the context of the global COVID-19 pandemic, which necessitated consideration that the patient might be at risk for infection with the SARS-CoV-2 virus that causes COVID-19. Institutional protocols and algorithms that pertain to the evaluation of patients at risk for COVID-19 are in a state of rapid change based on information released by regulatory bodies including the CDC and federal and state organizations. These policies and algorithms were followed during the patient's care in  the ED.     Intermittent dizziness. Spontaneous nonreproducible. Completely resolves. No focal deficits on exam. Patient does have unilateral nystagmus and abnormal head impulse test.  Normal test of skew.  All reassuring for peripheral process.  Doubt central process.  Intermittent chest pain. Highly inconsistent with ACS. EKG without acute ischemic changes or evidence of pericarditis. Given patient's comorbidities will obtain serial troponins. Low suspicion for pulmonary embolism.  Presentation not classic for aortic dissection or esophageal perforation. Will obtain chest x-ray to rule out pneumonia, pneumothorax, pneumomediastinum -do I feel this is unlikely cause of his symptoms.  Additionally will obtain screening labs to assess for anemia, electrolyte derangements, renal sufficiency.  Pertinent labs & imaging results that were available during my care of the patient were reviewed by me and considered in my medical decision making:  Patient care turned over to oncoming provider. Patient case and results discussed in detail; please see their note for further ED managment.     Final Clinical Impression(s) / ED Diagnoses Final diagnoses:  Chest pain     This chart was dictated using voice recognition software.  Despite best efforts to proofread,  errors can occur which can change the documentation meaning.   Fatima Blank, MD 09/05/21 [phone]

## 2021-09-05 NOTE — ED Provider Notes (Signed)
Received the patient in signout from Dr. Leonette Monarch.  Briefly the patient is a 80 year old male with some intermittent dizziness off and on for the past week or so and developed some chest twinges.  Has a history of a coronary CT scan with the high risk score.  Nuclear negative after.  No hx of cath. Plan for delta troponin and likely discharge home if negative.  Delta is negative.  Discussed with patient.  We will have him follow-up with Dr. Marlou Porch in the office.   Deno Etienne, DO 09/05/21 1014

## 2021-09-17 DIAGNOSIS — H16223 Keratoconjunctivitis sicca, not specified as Sjogren's, bilateral: Secondary | ICD-10-CM | POA: Diagnosis not present

## 2021-09-28 ENCOUNTER — Encounter: Payer: Self-pay | Admitting: Cardiology

## 2021-09-28 ENCOUNTER — Other Ambulatory Visit: Payer: Self-pay

## 2021-09-28 ENCOUNTER — Ambulatory Visit: Payer: Medicare HMO | Admitting: Cardiology

## 2021-09-28 VITALS — BP 100/60 | HR 90 | Ht 67.0 in | Wt 130.0 lb

## 2021-09-28 DIAGNOSIS — I6529 Occlusion and stenosis of unspecified carotid artery: Secondary | ICD-10-CM

## 2021-09-28 DIAGNOSIS — E7800 Pure hypercholesterolemia, unspecified: Secondary | ICD-10-CM

## 2021-09-28 DIAGNOSIS — I2510 Atherosclerotic heart disease of native coronary artery without angina pectoris: Secondary | ICD-10-CM | POA: Diagnosis not present

## 2021-09-28 DIAGNOSIS — I351 Nonrheumatic aortic (valve) insufficiency: Secondary | ICD-10-CM

## 2021-09-28 NOTE — Patient Instructions (Signed)
Medication Instructions:  The current medical regimen is effective;  continue present plan and medications.  *If you need a refill on your cardiac medications before your next appointment, please call your pharmacy*  Testing/Procedures: Your physician has requested that you have an echocardiogram. Echocardiography is a painless test that uses sound waves to create images of your heart. It provides your doctor with information about the size and shape of your heart and how well your heart's chambers and valves are working. This procedure takes approximately one hour. There are no restrictions for this procedure.  Your physician has requested that you have a carotid duplex. This test is an ultrasound of the carotid arteries in your neck. It looks at blood flow through these arteries that supply the brain with blood. Allow one hour for this exam. There are no restrictions or special instructions.  Follow-Up: At [facility], you and your health needs are our priority.  As part of our continuing mission to provide you with exceptional heart care, we have created designated Provider Care Teams.  These Care Teams include your primary Cardiologist (physician) and Advanced Practice Providers (APPs -  Physician Assistants and Nurse Practitioners) who all work together to provide you with the care you need, when you need it.  We recommend signing up for the patient portal called "MyChart".  Sign up information is provided on this After Visit Summary.  MyChart is used to connect with patients for Virtual Visits (Telemedicine).  Patients are able to view lab/test results, encounter notes, upcoming appointments, etc.  Non-urgent messages can be sent to your provider as well.   To learn more about what you can do with MyChart, go to [email or web address].    Your next appointment:   1 year(s)  The format for your next appointment:   In Person  Provider:   Candee Furbish, MD {   Thank you for choosing  [facility]!!

## 2021-09-28 NOTE — Assessment & Plan Note (Signed)
Continue with atorvastatin 40 mg.  Excellent.  Previous LDL less than 70.

## 2021-09-28 NOTE — Assessment & Plan Note (Signed)
Statin therapy, aspirin.  Less than 39%.  This was a few years ago.  Rechecking carotid Dopplers.

## 2021-09-28 NOTE — Progress Notes (Signed)
Cardiology Office Note:    Date:  09/28/2021   ID:  Peter Potts, DOB 22-Oct-1941, MRN [id number]  PCP:  Shanon Ace, [facility] Providers Cardiologist:  Candee Furbish, MD     Referring MD: Lawerance Cruel, MD    History of Present Illness:    Peter Potts is a 80 y.o. male here for follow-up elevated coronary calcium score of 420 with bilateral carotid artery plaque, mild, mild to moderate aortic stenosis and regurgitation, hyperlipidemia who was in the emergency department on 09/05/2021 with intermittent dizziness off and on with twinges of chest discomfort.  No prior history of cardiac catheterization.  The chest pain last maybe a second at a time.  Nonradiating nonexertional.  Very active enjoys pickleball walks 40 minutes a day light weight training.  Also had some dizziness balance issues 30 minutes at a time that sometimes completely resolve without intervention.  No triggers.  Some improvement was noted after performing Epley maneuvers.  Past Medical History:  Diagnosis Date   CAD (coronary artery disease)    GERD (gastroesophageal reflux disease)    Heart murmur    Kidney stones     Past Surgical History:  Procedure Laterality Date   COLON RESECTION  2011   Dr Merleen Milliner resection - (22 x 3.5 cm), mesentery (6.0 x 1.5cm) diverticulitis   COLONOSCOPY  10/28/2014   Mild neosigmoid diverticulosis. Internal hemorrhoids. Otherwise normal colonoscopy to terminal ileum    Current Medications: Current Meds  Medication Sig   aspirin 81 MG tablet Take 81 mg by mouth daily.   atorvastatin (LIPITOR) 40 MG tablet TAKE 1 TABLET BY MOUTH EVERY DAY   cetirizine (ZYRTEC) 10 MG tablet Take 1 tablet (10 mg total) by mouth daily.   Multiple Vitamins-Minerals (ZINC PO) Take by mouth.   Omega-3 Fatty Acids (FISH OIL PO) Take 2,400 mg by mouth daily.   omeprazole (PRILOSEC) 20 MG capsule Take 1 capsule (20 mg total) by mouth daily.    Probiotic Product (PROBIOTIC PO) Take by mouth.   zolpidem (AMBIEN) 10 MG tablet Take 10 mg by mouth as needed (on vacations).     Allergies:   Patient has no known allergies.   Social History   Socioeconomic History   Marital status: Married    Spouse name: Not on file   Number of children: Not on file   Years of education: Not on file   Highest education level: Not on file  Occupational History   Not on file  Tobacco Use   Smoking status: Never   Smokeless tobacco: Never  Vaping Use   Vaping Use: Never used  Substance and Sexual Activity   Alcohol use: Yes    Alcohol/week: 1.0 - 2.0 standard drink    Types: 1 - 2 Glasses of wine per week   Drug use: Never   Sexual activity: Not on file  Other Topics Concern   Not on file  Social History Narrative   Not on file   Social Determinants of Health   Financial Resource Strain: Not on file  Food Insecurity: Not on file  Transportation Needs: Not on file  Physical Activity: Not on file  Stress: Not on file  Social Connections: Not on file     Family History: The patient's family history includes Heart Problems in his paternal grandfather. There is no history of Colon cancer or Esophageal cancer.  ROS:   Please see the history of present illness.  All other systems reviewed and are negative.  EKGs/Labs/Other Studies Reviewed:    The following studies were reviewed today:  Calcium score 2018, CAD, calcific plaque Ascending aorta 3.2 cm coronary score of 438.  61st percentile.  Nuclear stress test 2018 -No ischemia no transient ischemic dilatation   Echocardiogram 2018 -Mild LVH EF 60% -Mild aortic stenosis  Echocardiogram 09/26/2020   1. Left ventricular ejection fraction, by estimation, is 65 to 70%. The  left ventricle has normal function. The left ventricle has no regional  wall motion abnormalities. Left ventricular diastolic parameters are  consistent with Grade I diastolic  dysfunction (impaired  relaxation).   2. Right ventricular systolic function is normal. The right ventricular  size is normal. There is normal pulmonary artery systolic pressure. The  estimated right ventricular systolic pressure is 62.2 mmHg.   3. The mitral valve is normal in structure. No evidence of mitral valve  regurgitation. No evidence of mitral stenosis.   4. The aortic valve is normal in structure. There is severe calcifcation  of the aortic valve. There is severe thickening of the aortic valve.  Aortic valve regurgitation is mild. Mild to moderate aortic valve  stenosis. Aortic valve mean gradient measures   17.0 mmHg.   5. The inferior vena cava is dilated in size with <50% respiratory  variability, suggesting right atrial pressure of 15 mmHg.    Carotid Dopplers 2019 -Mild bilaterally  EKG: 09/05/2021 shows sinus rhythm normal intervals no other significant abnormalities.  Recent Labs: 09/05/2021: ALT 19; BUN 16; Creatinine, Ser 0.97; Hemoglobin 15.4; Platelets 160; Potassium 4.0; Sodium 138  Recent Lipid Panel    Component Value Date/Time   CHOL 129 09/19/2016 0756   TRIG 79 09/19/2016 0756   HDL 49 09/19/2016 0756   CHOLHDL 2.6 09/19/2016 0756   VLDL 16 09/19/2016 0756   LDLCALC 64 09/19/2016 0756     Risk Assessment/Calculations:          Physical Exam:    VS:  BP 100/60 (BP Location: Left Arm, Patient Position: Sitting, Cuff Size: Normal)   Pulse 90   Ht 5\' 7"  (1.702 m)   Wt 130 lb (59 kg)   SpO2 97%   BMI 20.36 kg/m     Wt Readings from Last 3 Encounters:  09/28/21 130 lb (59 kg)  09/05/21 130 lb (59 kg)  02/18/21 130 lb (59 kg)     GEN:  Well nourished, well developed in no acute distress HEENT: Normal NECK: No JVD; No carotid bruits LYMPHATICS: No lymphadenopathy CARDIAC: RRR, no murmurs, rubs, gallops RESPIRATORY:  Clear to auscultation without rales, wheezing or rhonchi  ABDOMEN: Soft, non-tender, non-distended MUSCULOSKELETAL:  No edema; No deformity   SKIN: Warm and dry NEUROLOGIC:  Alert and oriented x 3 PSYCHIATRIC:  Normal affect   ASSESSMENT:    1. Nonrheumatic aortic valve insufficiency   2. Carotid atherosclerosis, unspecified laterality   3. Coronary artery disease involving native coronary artery of native heart without angina pectoris   4. Pure hypercholesterolemia    PLAN:    In order of problems listed above:  Coronary artery disease involving native coronary artery of native heart without angina pectoris 20 calcium score, nuclear stress test low risk with no ischemia.  Continue with aggressive risk factor modification.  Low-dose aspirin, high intensity statin.  On atorvastatin 40 mg. Prior check LDL was 48.  No myalgias.  Creatinine 1.04 hemoglobin 15.3 ALT 16.  Carotid artery plaque Statin therapy, aspirin.  Less than 39%.  This was a few years ago.  Rechecking carotid Dopplers.  Aortic regurgitation Mild aortic stenosis as well.  Continue to monitor.  Pure hypercholesterolemia Continue with atorvastatin 40 mg.  Excellent.  Previous LDL less than 70.      Medication Adjustments/Labs and Tests Ordered: Current medicines are reviewed at length with the patient today.  Concerns regarding medicines are outlined above.  Orders Placed This Encounter  Procedures   ECHOCARDIOGRAM COMPLETE   VAS US CAROTID   No orders of the defined types were placed in this encounter.   Patient Instructions  Medication Instructions:  The current medical regimen is effective;  continue present plan and medications.  *If you need a refill on your cardiac medications before your next appointment, please call your pharmacy*  Testing/Procedures: Your physician has requested that you have an echocardiogram. Echocardiography is a painless test that uses sound waves to create images of your heart. It provides your doctor with information about the size and shape of your heart and how well your heart's chambers and valves are working.  This procedure takes approximately one hour. There are no restrictions for this procedure.  Your physician has requested that you have a carotid duplex. This test is an ultrasound of the carotid arteries in your neck. It looks at blood flow through these arteries that supply the brain with blood. Allow one hour for this exam. There are no restrictions or special instructions.  Follow-Up: At [facility], you and your health needs are our priority.  As part of our continuing mission to provide you with exceptional heart care, we have created designated Provider Care Teams.  These Care Teams include your primary Cardiologist (physician) and Advanced Practice Providers (APPs -  Physician Assistants and Nurse Practitioners) who all work together to provide you with the care you need, when you need it.  We recommend signing up for the patient portal called "MyChart".  Sign up information is provided on this After Visit Summary.  MyChart is used to connect with patients for Virtual Visits (Telemedicine).  Patients are able to view lab/test results, encounter notes, upcoming appointments, etc.  Non-urgent messages can be sent to your provider as well.   To learn more about what you can do with MyChart, go to [email or web address].    Your next appointment:   1 year(s)  The format for your next appointment:   In Person  Provider:   Candee Furbish, MD {   Thank you for choosing [facility]!!     Signed, Candee Furbish, MD  09/28/2021 5:35 PM    [place]

## 2021-09-28 NOTE — Assessment & Plan Note (Signed)
20 calcium score, nuclear stress test low risk with no ischemia.  Continue with aggressive risk factor modification.  Low-dose aspirin, high intensity statin.  On atorvastatin 40 mg. Prior check LDL was 48.  No myalgias.  Creatinine 1.04 hemoglobin 15.3 ALT 16.

## 2021-09-28 NOTE — Assessment & Plan Note (Signed)
Mild aortic stenosis as well.  Continue to monitor.

## 2021-10-04 ENCOUNTER — Other Ambulatory Visit: Payer: Self-pay

## 2021-10-04 ENCOUNTER — Ambulatory Visit (HOSPITAL_COMMUNITY)
Admission: RE | Admit: 2021-10-04 | Discharge: 2021-10-04 | Disposition: A | Discharge status: Home / Self Care | Payer: Medicare HMO | Source: Ambulatory Visit | Attending: Cardiology | Admitting: Cardiology

## 2021-10-04 DIAGNOSIS — I6522 Occlusion and stenosis of left carotid artery: Secondary | ICD-10-CM | POA: Diagnosis not present

## 2021-10-04 DIAGNOSIS — I6529 Occlusion and stenosis of unspecified carotid artery: Secondary | ICD-10-CM | POA: Diagnosis not present

## 2021-10-06 ENCOUNTER — Other Ambulatory Visit: Payer: Self-pay | Admitting: Cardiology

## 2021-10-09 DIAGNOSIS — R42 Dizziness and giddiness: Secondary | ICD-10-CM | POA: Diagnosis not present

## 2021-10-09 DIAGNOSIS — R110 Nausea: Secondary | ICD-10-CM | POA: Diagnosis not present

## 2021-10-17 DIAGNOSIS — L570 Actinic keratosis: Secondary | ICD-10-CM | POA: Diagnosis not present

## 2021-10-22 ENCOUNTER — Other Ambulatory Visit: Payer: Self-pay

## 2021-10-22 ENCOUNTER — Ambulatory Visit (HOSPITAL_COMMUNITY): Payer: Medicare HMO | Attending: Cardiovascular Disease

## 2021-10-22 DIAGNOSIS — I351 Nonrheumatic aortic (valve) insufficiency: Secondary | ICD-10-CM | POA: Insufficient documentation

## 2021-10-22 LAB — ECHOCARDIOGRAM COMPLETE
AR max vel: 0.85 cm2
AV Area VTI: 0.89 cm2
AV Area mean vel: 0.81 cm2
AV Mean grad: 19 mmHg
AV Peak grad: 33.5 mmHg
Ao pk vel: 2.9 m/s
Area-P 1/2: 3.1 cm2
P 1/2 time: 366 msec
S' Lateral: 2.2 cm

## 2021-10-25 ENCOUNTER — Other Ambulatory Visit: Payer: Self-pay | Admitting: *Deleted

## 2021-10-25 DIAGNOSIS — I351 Nonrheumatic aortic (valve) insufficiency: Secondary | ICD-10-CM

## 2021-10-25 NOTE — Progress Notes (Signed)
Normal pump function Aortic stenosis has increased but is still in moderate range. Repeat echo in one year.  Candee Furbish, MD  Written by Jerline Pain, MD on 10/23/2021  6:04 AM EST

## 2022-01-18 ENCOUNTER — Other Ambulatory Visit: Payer: Self-pay | Admitting: Gastroenterology

## 2022-07-12 ENCOUNTER — Other Ambulatory Visit: Payer: Self-pay | Admitting: Gastroenterology

## 2022-10-08 ENCOUNTER — Ambulatory Visit (HOSPITAL_COMMUNITY): Payer: Medicare HMO | Attending: Cardiology

## 2022-10-08 DIAGNOSIS — I351 Nonrheumatic aortic (valve) insufficiency: Secondary | ICD-10-CM

## 2022-10-08 DIAGNOSIS — I350 Nonrheumatic aortic (valve) stenosis: Secondary | ICD-10-CM

## 2022-10-08 LAB — ECHOCARDIOGRAM COMPLETE
AR max vel: 0.84 cm2
AV Area VTI: 0.78 cm2
AV Area mean vel: 0.77 cm2
AV Mean grad: 32 mmHg
AV Peak grad: 48.5 mmHg
Ao pk vel: 3.48 m/s
Area-P 1/2: 3.59 cm2
P 1/2 time: 346 msec
S' Lateral: 2 cm

## 2022-10-11 ENCOUNTER — Other Ambulatory Visit: Payer: Self-pay | Admitting: Cardiology

## 2022-10-30 ENCOUNTER — Ambulatory Visit: Payer: Medicare HMO | Admitting: Cardiology

## 2022-11-02 ENCOUNTER — Emergency Department (HOSPITAL_BASED_OUTPATIENT_CLINIC_OR_DEPARTMENT_OTHER): Payer: Medicare HMO

## 2022-11-02 ENCOUNTER — Encounter (HOSPITAL_BASED_OUTPATIENT_CLINIC_OR_DEPARTMENT_OTHER): Payer: Self-pay | Admitting: Emergency Medicine

## 2022-11-02 ENCOUNTER — Other Ambulatory Visit: Payer: Self-pay

## 2022-11-02 ENCOUNTER — Emergency Department (HOSPITAL_BASED_OUTPATIENT_CLINIC_OR_DEPARTMENT_OTHER)
Admission: EM | Admit: 2022-11-02 | Discharge: 2022-11-02 | Disposition: A | Discharge status: Home / Self Care | Payer: Medicare HMO | Attending: Emergency Medicine | Admitting: Emergency Medicine

## 2022-11-02 DIAGNOSIS — Z7982 Long term (current) use of aspirin: Secondary | ICD-10-CM | POA: Diagnosis not present

## 2022-11-02 DIAGNOSIS — N281 Cyst of kidney, acquired: Secondary | ICD-10-CM

## 2022-11-02 DIAGNOSIS — N201 Calculus of ureter: Secondary | ICD-10-CM

## 2022-11-02 DIAGNOSIS — R109 Unspecified abdominal pain: Secondary | ICD-10-CM | POA: Diagnosis present

## 2022-11-02 LAB — CBC WITH DIFFERENTIAL/PLATELET
Abs Immature Granulocytes: 0.04 10*3/uL (ref 0.00–0.07)
Basophils Absolute: 0 10*3/uL (ref 0.0–0.1)
Basophils Relative: 1 %
Eosinophils Absolute: 0.1 10*3/uL (ref 0.0–0.5)
Eosinophils Relative: 1 %
HCT: 45.3 % (ref 39.0–52.0)
Hemoglobin: 15.2 g/dL (ref 13.0–17.0)
Immature Granulocytes: 1 %
Lymphocytes Relative: 23 %
Lymphs Abs: 1.9 10*3/uL (ref 0.7–4.0)
MCH: 31 pg (ref 26.0–34.0)
MCHC: 33.6 g/dL (ref 30.0–36.0)
MCV: 92.3 fL (ref 80.0–100.0)
Monocytes Absolute: 0.6 10*3/uL (ref 0.1–1.0)
Monocytes Relative: 7 %
Neutro Abs: 5.7 10*3/uL (ref 1.7–7.7)
Neutrophils Relative %: 67 %
Platelets: 163 10*3/uL (ref 150–400)
RBC: 4.91 MIL/uL (ref 4.22–5.81)
RDW: 12.9 % (ref 11.5–15.5)
WBC: 8.3 10*3/uL (ref 4.0–10.5)
nRBC: 0 % (ref 0.0–0.2)

## 2022-11-02 LAB — COMPREHENSIVE METABOLIC PANEL
ALT: 23 U/L (ref 0–44)
AST: 36 U/L (ref 15–41)
Albumin: 3.6 g/dL (ref 3.5–5.0)
Alkaline Phosphatase: 108 U/L (ref 38–126)
Anion gap: 5 (ref 5–15)
BUN: 19 mg/dL (ref 8–23)
CO2: 26 mmol/L (ref 22–32)
Calcium: 8.8 mg/dL — ABNORMAL LOW (ref 8.9–10.3)
Chloride: 110 mmol/L (ref 98–111)
Creatinine, Ser: 1.18 mg/dL (ref 0.61–1.24)
GFR, Estimated: >60 mL/min (ref >60)
Glucose, Bld: 149 mg/dL — ABNORMAL HIGH (ref 70–99)
Potassium: 3.9 mmol/L (ref 3.5–5.1)
Sodium: 141 mmol/L (ref 135–145)
Total Bilirubin: 1.2 mg/dL (ref 0.3–1.2)
Total Protein: 6.7 g/dL (ref 6.5–8.1)

## 2022-11-02 LAB — URINALYSIS, ROUTINE W REFLEX MICROSCOPIC
Bilirubin Urine: NEGATIVE
Glucose, UA: NEGATIVE mg/dL
Ketones, ur: NEGATIVE mg/dL
Leukocytes,Ua: NEGATIVE
Nitrite: NEGATIVE
Protein, ur: NEGATIVE mg/dL
Specific Gravity, Urine: 1.025 (ref 1.005–1.030)
pH: 6 (ref 5.0–8.0)

## 2022-11-02 LAB — URINALYSIS, MICROSCOPIC (REFLEX)

## 2022-11-02 MED ORDER — ONDANSETRON HCL 4 MG/2ML IJ SOLN
4.0000 mg | Freq: Once | INTRAMUSCULAR | Status: AC
  Administered 2022-11-02: 4 mg via INTRAVENOUS
  Filled 2022-11-02: qty 2

## 2022-11-02 MED ORDER — KETOROLAC TROMETHAMINE 30 MG/ML IJ SOLN
15.0000 mg | Freq: Once | INTRAMUSCULAR | Status: AC
  Administered 2022-11-02: 15 mg via INTRAVENOUS
  Filled 2022-11-02: qty 1

## 2022-11-02 MED ORDER — NAPROXEN 500 MG PO TABS: 500.0000 mg | ORAL_TABLET | Freq: Two times a day (BID) | ORAL | 0 refills | Status: DC

## 2022-11-02 MED ORDER — HYDROCODONE-ACETAMINOPHEN 5-325 MG PO TABS: 1.0000 | ORAL_TABLET | Freq: Four times a day (QID) | ORAL | 0 refills | Status: DC | PRN

## 2022-11-02 MED ORDER — ONDANSETRON 4 MG PO TBDP: 4.0000 mg | ORAL_TABLET | Freq: Three times a day (TID) | ORAL | 0 refills | Status: DC | PRN

## 2022-11-02 NOTE — ED Provider Notes (Signed)
[facility] EMERGENCY DEPARTMENT Provider Note   CSN: [id number] Arrival date & time: 11/02/22  3875     History  No chief complaint on file.   Basilio Meadow III is a 81 y.o. male.  This is an 81 year old male with history of atherosclerosis and kidney stones presents the ER with left flank pain that started suddenly at 2 AM that woke him from sleep.  No fevers or chills or dysuria or any other complaints.  He is having trouble getting comfortable because of the pain.  He states he can feel it going into his left inguinal area.  Is likely her prior kidney stones that he has not had no kidney stones the past 12 years he states.  No chest pain or shortness of breath or other associated symptoms.        Home Medications Prior to Admission medications   Medication Sig Start Date End Date Taking? Authorizing Provider  aspirin 81 MG tablet Take 81 mg by mouth daily.    [provider]  atorvastatin (LIPITOR) 40 MG tablet TAKE 1 TABLET BY MOUTH EVERY DAY 10/14/22   Jerline Pain, MD  cetirizine (ZYRTEC) 10 MG tablet Take 1 tablet (10 mg total) by mouth daily. 11/03/18   Kozlow, Donnamarie Poag, MD  Multiple Vitamins-Minerals (ZINC PO) Take by mouth.    [provider]  Omega-3 Fatty Acids (FISH OIL PO) Take 2,400 mg by mouth daily.    [provider]  omeprazole (PRILOSEC) 20 MG capsule Take 1 capsule (20 mg total) by mouth daily. Please call [phone] to schedule an office visit for more refills 01/18/22   Jackquline Denmark, MD  Probiotic Product (PROBIOTIC PO) Take by mouth.    [provider]  zolpidem (AMBIEN) 10 MG tablet Take 10 mg by mouth as needed (on vacations). 07/08/17   [provider]      Allergies    Patient has no known allergies.    Review of Systems   Review of Systems  Genitourinary:  Positive for flank pain.    Physical Exam Updated Vital Signs BP (!) 149/73 (BP Location: Left Arm)   Pulse 74   Temp 97.9 F  (36.6 C) (Oral)   Resp 18   SpO2 99%  Physical Exam Vitals and nursing note reviewed.  Constitutional:      General: He is not in acute distress.    Appearance: He is well-developed.  HENT:     Head: Normocephalic and atraumatic.  Eyes:     Conjunctiva/sclera: Conjunctivae normal.  Cardiovascular:     Rate and Rhythm: Normal rate and regular rhythm.     Heart sounds: No murmur heard. Pulmonary:     Effort: Pulmonary effort is normal. No respiratory distress.     Breath sounds: Normal breath sounds.  Abdominal:     Palpations: Abdomen is soft.     Tenderness: There is no abdominal tenderness. There is left CVA tenderness.  Musculoskeletal:        General: No swelling.     Cervical back: Neck supple.  Skin:    General: Skin is warm and dry.     Capillary Refill: Capillary refill takes less than 2 seconds.  Neurological:     General: No focal deficit present.     Mental Status: He is alert and oriented to person, place, and time.  Psychiatric:        Mood and Affect: Mood normal.  Behavior: Behavior normal.     ED Results / Procedures / Treatments   Labs (all labs ordered are listed, but only abnormal results are displayed) Labs Reviewed  URINE CULTURE  URINALYSIS, ROUTINE W REFLEX MICROSCOPIC  COMPREHENSIVE METABOLIC PANEL  CBC WITH DIFFERENTIAL/PLATELET    EKG None  Radiology No results found.  Procedures Procedures    Medications Ordered in ED Medications  ondansetron (ZOFRAN) injection 4 mg (has no administration in time range)  ketorolac (TORADOL) 30 MG/ML injection 15 mg (has no administration in time range)    ED Course/ Medical Decision Making/ A&P                           Medical Decision Making Differential diagnosis: Ureterolithiasis, testicular torsion, pyelonephritis, aortic dissection, muscle strain, other Data review: CBC and CMP are reassuring, urinalysis shows hematuria consistent with a kidney stone.  CT reviewed, 3 mm stone  left distal ureter, patient also has large left renal cyst.  He is informed of all of these results MDM: 81 year old male presents to ER with left flank pain that feels exactly her prior kidney stones.  He is mildly uncomfortable, standing and rocking back and forth but able to sit in the bed and presenting exam.  He has got some mild left CVA tenderness, states he feels like going to the left inguinal area.  Given history and exam this is likely a renal stone.  Given his age will get labs and CT to further evaluate.  His vitals are reassuring.  Extremely low suspicion for aortic dissection given reassuring vitals and overall well appearance.  CT showed left renal stone, patient's pain is significantly improved with IV Toradol, discussed findings of renal cyst and renal stone with the patient.  He is going to follow-up with urology.  He will be given hydrocodone to use if pain is not controlled with the naproxen.  I discussed with patient that he needs to be careful with this as it could make him sleepy or dizzy.  Verbalized understanding.    Amount and/or Complexity of Data Reviewed Labs: ordered. Radiology: ordered.  Risk Prescription drug management.           Final Clinical Impression(s) / ED Diagnoses Final diagnoses:  None    Rx / DC Orders ED Discharge Orders     None         Gwenevere Abbot, PA-C 11/02/22 1004    Long, Wonda Old[facility], MD 11/02/22 1016

## 2022-11-02 NOTE — ED Notes (Signed)
Pt ambulatory with steady gait to restroom 

## 2022-11-02 NOTE — ED Triage Notes (Signed)
Pt arrives pov, slow gait, endorses concern for kidney stone. Pt c/o LT flank pain with nausea 3 hrs pta

## 2022-11-02 NOTE — Discharge Instructions (Addendum)
You have a kidney stone on the left side that is 3 mm.  This will likely pass on its own.  Use the pain medications as prescribed to control your pain and the nausea medicine as needed to help with your nausea.  Come back if you have worsening pain not controlled with the medications, fevers, chills, or vomiting.  Follow-up with urologist as instructed.

## 2022-11-02 NOTE — ED Notes (Signed)
ED Provider at bedside. 

## 2022-11-03 LAB — URINE CULTURE: Culture: NO GROWTH

## 2022-11-08 ENCOUNTER — Other Ambulatory Visit: Payer: Self-pay | Admitting: Cardiology

## 2022-12-06 ENCOUNTER — Ambulatory Visit: Payer: Medicare HMO | Admitting: Cardiology

## 2022-12-09 ENCOUNTER — Encounter: Payer: Self-pay | Admitting: Cardiology

## 2022-12-09 ENCOUNTER — Ambulatory Visit: Payer: Medicare HMO | Attending: Cardiology | Admitting: Cardiology

## 2022-12-09 VITALS — BP 120/72 | HR 72 | Ht 67.0 in | Wt 133.0 lb

## 2022-12-09 DIAGNOSIS — I2510 Atherosclerotic heart disease of native coronary artery without angina pectoris: Secondary | ICD-10-CM | POA: Diagnosis not present

## 2022-12-09 DIAGNOSIS — I350 Nonrheumatic aortic (valve) stenosis: Secondary | ICD-10-CM

## 2022-12-09 NOTE — Patient Instructions (Signed)
Medication Instructions:  The current medical regimen is effective;  continue present plan and medications.  *If you need a refill on your cardiac medications before your next appointment, please call your pharmacy*  You have been referred to our Structural Heart Team for further evaluation and treatment of Aortic Stenosis.  You will be contacted to be scheduled.  Follow-Up: At [facility], you and your health needs are our priority.  As part of our continuing mission to provide you with exceptional heart care, we have created designated Provider Care Teams.  These Care Teams include your primary Cardiologist (physician) and Advanced Practice Providers (APPs -  Physician Assistants and Nurse Practitioners) who all work together to provide you with the care you need, when you need it.  We recommend signing up for the patient portal called "MyChart".  Sign up information is provided on this After Visit Summary.  MyChart is used to connect with patients for Virtual Visits (Telemedicine).  Patients are able to view lab/test results, encounter notes, upcoming appointments, etc.  Non-urgent messages can be sent to your provider as well.   To learn more about what you can do with MyChart, go to [email or web address].    Your next appointment:   6 month(s)  Provider:   Candee Furbish, MD

## 2022-12-09 NOTE — Progress Notes (Signed)
Cardiology Office Note:    Date:  12/09/2022   ID:  Peter Potts, DOB [birth year], MRN [id number]  PCP:  Shanon Ace, MD   [facility] Providers Cardiologist:  Candee Furbish, MD     Referring MD: Shanon Ace,*    History of Present Illness:    Peter Potts is a 82 y.o. male here for follow-up elevated coronary calcium score of 420 with bilateral carotid artery plaque, aortic stenosis and regurgitation, hyperlipidemia who was in the emergency department on 09/05/2021 with intermittent dizziness off and on with twinges of chest discomfort.  No prior history of cardiac catheterization.  The chest pain last maybe a second at a time.  Nonradiating nonexertional.  Very active enjoys pickleball walks 40 minutes a day light weight training. Feeling tired after. At times had to sit down at home. A little effort to speak.   Also had some dizziness balance issues 30 minutes at a time that sometimes completely resolve without intervention.  No triggers.  Some improvement was noted after performing Epley maneuvers.  Past Medical History:  Diagnosis Date   CAD (coronary artery disease)    GERD (gastroesophageal reflux disease)    Heart murmur    Kidney stones     Past Surgical History:  Procedure Laterality Date   COLON RESECTION  2011   Dr Merleen Milliner resection - (22 x 3.5 cm), mesentery (6.0 x 1.5cm) diverticulitis   COLONOSCOPY  10/28/2014   Mild neosigmoid diverticulosis. Internal hemorrhoids. Otherwise normal colonoscopy to terminal ileum    Current Medications: Current Meds  Medication Sig   aspirin 81 MG tablet Take 81 mg by mouth daily.   atorvastatin (LIPITOR) 40 MG tablet TAKE 1 TABLET BY MOUTH EVERY DAY   cetirizine (ZYRTEC) 10 MG tablet Take 1 tablet (10 mg total) by mouth daily.   Multiple Vitamins-Minerals (ZINC PO) Take by mouth.   Omega-3 Fatty Acids (FISH OIL PO) Take 2,400 mg by mouth daily.   omeprazole (PRILOSEC) 20 MG  capsule Take 1 capsule (20 mg total) by mouth daily. Please call [phone] to schedule an office visit for more refills   Probiotic Product (PROBIOTIC PO) Take by mouth.   zolpidem (AMBIEN) 10 MG tablet Take 10 mg by mouth as needed (on vacations).     Allergies:   Patient has no known allergies.   Social History   Socioeconomic History   Marital status: Married    Spouse name: Not on file   Number of children: Not on file   Years of education: Not on file   Highest education level: Not on file  Occupational History   Not on file  Tobacco Use   Smoking status: Never   Smokeless tobacco: Never  Vaping Use   Vaping Use: Never used  Substance and Sexual Activity   Alcohol use: Yes    Alcohol/week: 1.0 - 2.0 standard drink of alcohol    Types: 1 - 2 Glasses of wine per week   Drug use: Never   Sexual activity: Not on file  Other Topics Concern   Not on file  Social History Narrative   Not on file   Social Determinants of Health   Financial Resource Strain: Not on file  Food Insecurity: Not on file  Transportation Needs: Not on file  Physical Activity: Not on file  Stress: Not on file  Social Connections: Not on file     Family History: The patient's family history includes Heart Problems in  his paternal grandfather. There is no history of Colon cancer or Esophageal cancer.  ROS:   Please see the history of present illness.     All other systems reviewed and are negative.  EKGs/Labs/Other Studies Reviewed:    The following studies were reviewed today:  Calcium score 2018, CAD, calcific plaque Ascending aorta 3.2 cm coronary score of 438.  61st percentile.  Nuclear stress test 2018 -No ischemia no transient ischemic dilatation   Echocardiogram 09/2022  1. Calcified aortic valve with severe AS (mean gradient 32 mmHg; AVA 0.8  cm2; DI 0.21). Compared to previous, AS is slightly worse.   2. Left ventricular ejection fraction, by estimation, is 60 to 65%. The   left ventricle has normal function. The left ventricle has no regional  wall motion abnormalities. Left ventricular diastolic parameters were  normal.   3. Right ventricular systolic function is normal. The right ventricular  size is normal.   4. The mitral valve is normal in structure. Mild mitral valve  regurgitation. No evidence of mitral stenosis.   5. The aortic valve is calcified. Aortic valve regurgitation is mild.  Severe aortic valve stenosis.   6. The inferior vena cava is normal in size with greater than 50%  respiratory variability, suggesting right atrial pressure of 3 mmHg.    Carotid Dopplers 2022 -Mild bilaterally  EKG:  12/09/2022-normal sinus rhythm 72 without any other abnormalities. 09/05/2021 shows sinus rhythm normal intervals no other significant abnormalities.  Recent Labs: 11/02/2022: ALT 23; BUN 19; Creatinine, Ser 1.18; Hemoglobin 15.2; Platelets 163; Potassium 3.9; Sodium 141  Recent Lipid Panel    Component Value Date/Time   CHOL 129 09/19/2016 0756   TRIG 79 09/19/2016 0756   HDL 49 09/19/2016 0756   CHOLHDL 2.6 09/19/2016 0756   VLDL 16 09/19/2016 0756   LDLCALC 64 09/19/2016 0756     Risk Assessment/Calculations:          Physical Exam:    VS:  BP 120/72   Pulse 72   Ht '5\' 7"'$  (1.702 m)   Wt 133 lb (60.3 kg)   SpO2 98%   BMI 20.83 kg/m     Wt Readings from Last 3 Encounters:  12/09/22 133 lb (60.3 kg)  11/02/22 131 lb (59.4 kg)  09/28/21 130 lb (59 kg)     GEN: Thin in no acute distress HEENT: Normal NECK: No JVD; radiation of murmur LYMPHATICS: No lymphadenopathy CARDIAC: RRR, 3/6 SM,no rubs, gallops RESPIRATORY:  Clear to auscultation without rales, wheezing or rhonchi  ABDOMEN: Soft, non-tender, non-distended MUSCULOSKELETAL:  No edema; No deformity  SKIN: Warm and dry NEUROLOGIC:  Alert and oriented x 3 PSYCHIATRIC:  Normal affect   ASSESSMENT:    1. Nonrheumatic aortic valve stenosis   2. Coronary artery  disease involving native coronary artery of native heart without angina pectoris     PLAN:    In order of problems listed above:   Coronary artery disease involving native coronary artery of native heart without angina pectoris 438 calcium score in 2018, nuclear stress test low risk with no ischemia in 2018.  Continue with aggressive risk factor modification.  Low-dose aspirin, high intensity statin.  On atorvastatin 40 mg. Prior check LDL was 48.  No myalgias.  Creatinine 1.04 hemoglobin 15.3 ALT 16.  Carotid artery plaque Statin therapy, aspirin.  Less than 39%.  This was 2022.    Pure hypercholesterolemia Continue with atorvastatin 40 mg.  Excellent.  Previous LDL less than 70.  Aortic stenosis ECHO 10/08/22: Mean gradient across aortic valve is 32 mmHg, moderate however aortic calculated valve area is 0.8 cm which is in the severe range.  EF 65%.  Aortic stenosis is slightly worse.  He does seem to have some changes in his stamina.  I will refer him to the structural heart team for further discussion of aortic valve replacement.    Medication Adjustments/Labs and Tests Ordered: Current medicines are reviewed at length with the patient today.  Concerns regarding medicines are outlined above.  Orders Placed This Encounter  Procedures   Ambulatory referral to Structural Heart/Valve Clinic (only at [place])   EKG 12-Lead   No orders of the defined types were placed in this encounter.    Patient Instructions  Medication Instructions:  The current medical regimen is effective;  continue present plan and medications.  *If you need a refill on your cardiac medications before your next appointment, please call your pharmacy*  You have been referred to our Structural Heart Team for further evaluation and treatment of Aortic Stenosis.  You will be contacted to be scheduled.  Follow-Up: At [facility], you and your health needs are our priority.  As part of our  continuing mission to provide you with exceptional heart care, we have created designated Provider Care Teams.  These Care Teams include your primary Cardiologist (physician) and Advanced Practice Providers (APPs -  Physician Assistants and Nurse Practitioners) who all work together to provide you with the care you need, when you need it.  We recommend signing up for the patient portal called "MyChart".  Sign up information is provided on this After Visit Summary.  MyChart is used to connect with patients for Virtual Visits (Telemedicine).  Patients are able to view lab/test results, encounter notes, upcoming appointments, etc.  Non-urgent messages can be sent to your provider as well.   To learn more about what you can do with MyChart, go to [email or web address].    Your next appointment:   6 month(s)  Provider:   Candee Furbish, MD        Signed, Candee Furbish, MD  12/09/2022 9:53 AM    [place]

## 2022-12-18 ENCOUNTER — Encounter: Payer: Self-pay | Admitting: Cardiovascular Disease

## 2022-12-18 ENCOUNTER — Ambulatory Visit: Payer: Medicare HMO | Attending: Cardiovascular Disease | Admitting: Cardiovascular Disease

## 2022-12-18 VITALS — BP 128/60 | HR 85 | Ht 67.0 in | Wt 132.0 lb

## 2022-12-18 DIAGNOSIS — Z01812 Encounter for preprocedural laboratory examination: Secondary | ICD-10-CM | POA: Diagnosis not present

## 2022-12-18 DIAGNOSIS — I350 Nonrheumatic aortic (valve) stenosis: Secondary | ICD-10-CM

## 2022-12-18 LAB — CBC
Hematocrit: 42.6 % (ref 37.5–51.0)
Hemoglobin: 14.6 g/dL (ref 13.0–17.7)
MCH: 31.2 pg (ref 26.6–33.0)
MCHC: 34.3 g/dL (ref 31.5–35.7)
MCV: 91 fL (ref 79–97)
Platelets: 159 10*3/uL (ref 150–450)
RBC: 4.68 x10E6/uL (ref 4.14–5.80)
RDW: 14 % (ref 11.6–15.4)
WBC: 8.3 10*3/uL (ref 3.4–10.8)

## 2022-12-18 LAB — BASIC METABOLIC PANEL
BUN/Creatinine Ratio: 16 (ref 10–24)
BUN: 18 mg/dL (ref 8–27)
CO2: 28 mmol/L (ref 20–29)
Calcium: 9.1 mg/dL (ref 8.6–10.2)
Chloride: 105 mmol/L (ref 96–106)
Creatinine, Ser: 1.15 mg/dL (ref 0.76–1.27)
Glucose: 95 mg/dL (ref 70–99)
Potassium: 4.1 mmol/L (ref 3.5–5.2)
Sodium: 139 mmol/L (ref 134–144)
eGFR: 64 mL/min/{1.73_m2} (ref >59)

## 2022-12-18 NOTE — Progress Notes (Addendum)
Structural Heart Clinic Consult Note  Chief Complaint  Patient presents with   New Patient (Initial Visit)    Aortic stenosis   History of Present Illness: 82 yo male with history of CAD, GERD, carotid artery disease, hyperlipidemia and aortic stenosis here today as a new consult, referred by Dr. Marlou Porch, for further discussion regarding his aortic stenosis. He has evidence of CAD by coronary calcium scoring. He saw Dr. Marlou Porch recently and described occasional resting chest pain, fatigue and dyspnea. Echo 10/08/22 with LVEF=60-65%. Mild mitral regurgitation. Severe paradoxical low flow/low gradient aortic valve stenosis with mean gradient of 32 mmHg, peak gradient 48 mmHg, AVA 0.77 cm2, DI 0.21, SVI 40. On my personal review of the echo images, thee appears to be severe aortic stenosis. The valve leaflets are thickened/calcified and do not open well.   He tells me today that he has progressive fatigue. He plays pickleball 4 times per week. His energy level is down over the past year.  No chest pain. He has some dizziness but no syncope. No LE edema. e lives with his wife in [place], [place] in a retirement community. He is retired from Science [employer]. He has no active dental issues.    Primary Care Physician: Rikki Spearing, NP Primary Cardiologist: Marlou Porch Referring Cardiologist: Marlou Porch  Past Medical History:  Diagnosis Date   Aortic stenosis    CAD (coronary artery disease)    GERD (gastroesophageal reflux disease)    Heart murmur    Kidney stones     Past Surgical History:  Procedure Laterality Date   COLON RESECTION  2011   Dr Merleen Milliner resection - (22 x 3.5 cm), mesentery (6.0 x 1.5cm) diverticulitis   COLONOSCOPY  10/28/2014   Mild neosigmoid diverticulosis. Internal hemorrhoids. Otherwise normal colonoscopy to terminal ileum    Current Outpatient Medications  Medication Sig Dispense Refill   aspirin 81 MG tablet Take 81 mg by mouth daily.     atorvastatin (LIPITOR) 40 MG tablet  TAKE 1 TABLET BY MOUTH EVERY DAY 90 tablet 1   cetirizine (ZYRTEC) 10 MG tablet Take 1 tablet (10 mg total) by mouth daily. 90 tablet 2   Multiple Vitamins-Minerals (ZINC PO) Take by mouth.     Omega-3 Fatty Acids (FISH OIL PO) Take 2,400 mg by mouth daily.     omeprazole (PRILOSEC) 20 MG capsule Take 1 capsule (20 mg total) by mouth daily. Please call [phone] to schedule an office visit for more refills 90 capsule 1   Probiotic Product (PROBIOTIC PO) Take by mouth.     zolpidem (AMBIEN) 10 MG tablet Take 10 mg by mouth as needed (on vacations).  0   No current facility-administered medications for this visit.    No Known Allergies  Social History   Socioeconomic History   Marital status: Married    Spouse name: Not on file   Number of children: 1   Years of education: Not on file   Highest education level: Not on file  Occupational History   Occupation: Retired from Science writer  Tobacco Use   Smoking status: Never   Smokeless tobacco: Never  Vaping Use   Vaping Use: Never used  Substance and Sexual Activity   Alcohol use: Yes    Alcohol/week: 1.0 - 2.0 standard drink of alcohol    Types: 1 - 2 Glasses of wine per week   Drug use: Never   Sexual activity: Not on file  Other Topics Concern   Not on file  Social History  Narrative   Not on file   Social Determinants of Health   Financial Resource Strain: Not on file  Food Insecurity: Not on file  Transportation Needs: Not on file  Physical Activity: Not on file  Stress: Not on file  Social Connections: Not on file  Intimate Partner Violence: Not on file    Family History  Problem Relation Age of Onset   Heart failure Mother    Heart Problems Paternal Grandfather    Colon cancer Neg Hx    Esophageal cancer Neg Hx     Review of Systems:  As stated in the HPI and otherwise negative.   BP 128/60   Pulse 85   Ht '5\' 7"'$  (1.702 m)   Wt 59.9 kg   SpO2 98%   BMI 20.67 kg/m   Physical Examination: General: Well  developed, well nourished, NAD  HEENT: OP clear, mucus membranes moist  SKIN: warm, dry. No rashes. Neuro: No focal deficits  Musculoskeletal: Muscle strength 5/5 all ext  Psychiatric: Mood and affect normal  Neck: No JVD, no carotid bruits, no thyromegaly, no lymphadenopathy.  Lungs:Clear bilaterally, no wheezes, rhonci, crackles Cardiovascular: Regular rate and rhythm. Loud, harsh, late peaking systolic murmur.  Abdomen:Soft. Bowel sounds present. Non-tender.  Extremities: No lower extremity edema. Pulses are 2 + in the bilateral DP/PT.  EKG:  EKG is not ordered today. The ekg from last week shows NSR  Echo 10/10/22: 1. Calcified aortic valve with severe AS (mean gradient 32 mmHg; AVA 0.8  cm2; DI 0.21). Compared to previous, AS is slightly worse.   2. Left ventricular ejection fraction, by estimation, is 60 to 65%. The  left ventricle has normal function. The left ventricle has no regional  wall motion abnormalities. Left ventricular diastolic parameters were  normal.   3. Right ventricular systolic function is normal. The right ventricular  size is normal.   4. The mitral valve is normal in structure. Mild mitral valve  regurgitation. No evidence of mitral stenosis.   5. The aortic valve is calcified. Aortic valve regurgitation is mild.  Severe aortic valve stenosis.   6. The inferior vena cava is normal in size with greater than 50%  respiratory variability, suggesting right atrial pressure of 3 mmHg.   FINDINGS   Left Ventricle: Left ventricular ejection fraction, by estimation, is 60  to 65%. The left ventricle has normal function. The left ventricle has no  regional wall motion abnormalities. The left ventricular internal cavity  size was normal in size. There is   no left ventricular hypertrophy. Left ventricular diastolic parameters  were normal.   Right Ventricle: The right ventricular size is normal. Right ventricular  systolic function is normal.   Left Atrium:  Left atrial size was normal in size.   Right Atrium: Right atrial size was normal in size.   Pericardium: There is no evidence of pericardial effusion.   Mitral Valve: The mitral valve is normal in structure. Mild mitral valve  regurgitation. No evidence of mitral valve stenosis.   Tricuspid Valve: The tricuspid valve is normal in structure. Tricuspid  valve regurgitation is mild . No evidence of tricuspid stenosis.   Aortic Valve: The aortic valve is calcified. Aortic valve regurgitation is  mild. Aortic regurgitation PHT measures 346 msec. Severe aortic stenosis  is present. Aortic valve mean gradient measures 32.0 mmHg. Aortic valve  peak gradient measures 48.5 mmHg.  Aortic valve area, by VTI measures 0.78 cm.   Pulmonic Valve: The pulmonic valve was  normal in structure. Pulmonic valve  regurgitation is not visualized. No evidence of pulmonic stenosis.   Aorta: The aortic root is normal in size and structure.   Venous: The inferior vena cava is normal in size with greater than 50%  respiratory variability, suggesting right atrial pressure of 3 mmHg.   IAS/Shunts: No atrial level shunt detected by color flow Doppler.   Additional Comments: Calcified aortic valve with severe AS (mean gradient  32 mmHg; AVA 0.8 cm2; DI 0.21). Compared to previous, AS is slightly  worse.     LEFT VENTRICLE  PLAX 2D  LVIDd:         3.70 cm   Diastology  LVIDs:         2.00 cm   LV e' medial:    8.16 cm/s  LV PW:         1.00 cm   LV E/e' medial:  7.9  LV IVS:        1.00 cm   LV e' lateral:   10.18 cm/s  LVOT diam:     2.20 cm   LV E/e' lateral: 6.3  LV SV:         67  LV SV Index:   40  LVOT Area:     3.80 cm     RIGHT VENTRICLE             IVC  RV Basal diam:  3.50 cm     IVC diam: 1.60 cm  RV S prime:     16.50 cm/s  TAPSE (M-mode): 2.3 cm   LEFT ATRIUM             Index        RIGHT ATRIUM           Index  LA diam:        3.50 cm 2.08 cm/m   RA Area:     10.20 cm  LA Vol  (A2C):   47.5 ml 28.21 ml/m  RA Volume:   23.10 ml  13.72 ml/m  LA Vol (A4C):   28.8 ml 17.10 ml/m  LA Biplane Vol: 40.8 ml 24.23 ml/m   AORTIC VALVE  AV Area (Vmax):    0.84 cm  AV Area (Vmean):   0.77 cm  AV Area (VTI):     0.78 cm  AV Vmax:           348.20 cm/s  AV Vmean:          268.800 cm/s  AV VTI:            0.853 m  AV Peak Grad:      48.5 mmHg  AV Mean Grad:      32.0 mmHg  LVOT Vmax:         77.15 cm/s  LVOT Vmean:        54.600 cm/s  LVOT VTI:          0.176 m  LVOT/AV VTI ratio: 0.21  AI PHT:            346 msec    AORTA  Ao Root diam: 3.40 cm  Ao Asc diam:  3.00 cm   MITRAL VALVE               TRICUSPID VALVE  MV Area (PHT): 3.59 cm    TR Peak grad:   22.8 mmHg  MV Decel Time: 212 msec    TR Vmax:  239.00 cm/s  MV E velocity: 64.35 cm/s  MV A velocity: 73.25 cm/s  SHUNTS  MV E/A ratio:  0.88        Systemic VTI:  0.18 m                             Systemic Diam: 2.20 cm   Recent Labs: 11/02/2022: ALT 23; BUN 19; Creatinine, Ser 1.18; Hemoglobin 15.2; Platelets 163; Potassium 3.9; Sodium 141   Lipid Panel    Component Value Date/Time   CHOL 129 09/19/2016 0756   TRIG 79 09/19/2016 0756   HDL 49 09/19/2016 0756   CHOLHDL 2.6 09/19/2016 0756   VLDL 16 09/19/2016 0756   LDLCALC 64 09/19/2016 0756     Wt Readings from Last 3 Encounters:  12/18/22 59.9 kg  12/09/22 60.3 kg  11/02/22 59.4 kg    Assessment and Plan:   1. Severe Aortic Valve Stenosis: He has severe, stage D3 (paradoxical low flow/low gradient) aortic valve stenosis. NYHA class 2. I have personally reviewed the echo images. The aortic valve is thickened, calcified with limited leaflet mobility. I think he would benefit from AVR. Given advanced age, he is not a good candidate for conventional AVR by surgical approach. I think he may be a good candidate for TAVR.   I have reviewed the natural history of aortic stenosis with the patient and their family members  who are present  today. We have discussed the limitations of medical therapy and the poor prognosis associated with symptomatic aortic stenosis. We have reviewed potential treatment options, including palliative medical therapy, conventional surgical aortic valve replacement, and transcatheter aortic valve replacement. We discussed treatment options in the context of the patient's specific comorbid medical conditions.   He would like to proceed with planning for TAVR. I will arrange a right and left heart catheterization at [facility] 12/26/22 at 9am.. Risks and benefits of the cath procedure and the valve procedure are reviewed with the patient. After the cath, he will have a cardiac CT, CTA of the chest/abdomen and pelvis and will then be referred to see one of the CT surgeons on our TAVR team.     Labs/ tests ordered today include:   Orders Placed This Encounter  Procedures   Basic metabolic panel   CBC   Disposition:   F/U with the valve team.   Signed, Lauree Chandler, MD, [facility] 12/18/2022 3:09 PM    [address], [place], [place]  [address] Phone: [phone]; Fax: [phone]

## 2022-12-18 NOTE — H&P (View-Only) (Signed)
[facility] Consult Note  Chief Complaint  Patient presents with   New Patient (Initial Visit)    Aortic stenosis   History of Present Illness: 82 yo male with history of CAD, GERD, carotid artery disease, hyperlipidemia and aortic stenosis here today as a new consult, referred by Dr. Marlou Porch, for further discussion regarding his aortic stenosis. He has evidence of CAD by coronary calcium scoring. He saw Dr. Marlou Porch recently and described occasional resting chest pain, fatigue and dyspnea. Echo 10/08/22 with LVEF=60-65%. Mild mitral regurgitation. Severe paradoxical low flow/low gradient aortic valve stenosis with mean gradient of 32 mmHg, peak gradient 48 mmHg, AVA 0.77 cm2, DI 0.21, SVI 40. On my personal review of the echo images, thee appears to be severe aortic stenosis. The valve leaflets are thickened/calcified and do not open well.   He tells me today that he has progressive fatigue. He plays pickleball 4 times per week. His energy level is down over the past year.  No chest pain. He has some dizziness but no syncope. No LE edema. e lives with his wife in [place], [place] in a retirement community. He is retired from [employer]. He has no active dental issues.    Primary Care Physician: Rikki Spearing, NP Primary Cardiologist: Marlou Porch Referring Cardiologist: Marlou Porch  Past Medical History:  Diagnosis Date   Aortic stenosis    CAD (coronary artery disease)    GERD (gastroesophageal reflux disease)    Heart murmur    Kidney stones     Past Surgical History:  Procedure Laterality Date   COLON RESECTION  2011   Dr Merleen Milliner resection - (22 x 3.5 cm), mesentery (6.0 x 1.5cm) diverticulitis   COLONOSCOPY  10/28/2014   Mild neosigmoid diverticulosis. Internal hemorrhoids. Otherwise normal colonoscopy to terminal ileum    Current Outpatient Medications  Medication Sig Dispense Refill   aspirin 81 MG tablet Take 81 mg by mouth daily.     atorvastatin (LIPITOR) 40 MG tablet  TAKE 1 TABLET BY MOUTH EVERY DAY 90 tablet 1   cetirizine (ZYRTEC) 10 MG tablet Take 1 tablet (10 mg total) by mouth daily. 90 tablet 2   Multiple Vitamins-Minerals (ZINC PO) Take by mouth.     Omega-3 Fatty Acids (FISH OIL PO) Take 2,400 mg by mouth daily.     omeprazole (PRILOSEC) 20 MG capsule Take 1 capsule (20 mg total) by mouth daily. Please call [phone] to schedule an office visit for more refills 90 capsule 1   Probiotic Product (PROBIOTIC PO) Take by mouth.     zolpidem (AMBIEN) 10 MG tablet Take 10 mg by mouth as needed (on vacations).  0   No current facility-administered medications for this visit.    No Known Allergies  Social History   Socioeconomic History   Marital status: Married    Spouse name: Not on file   Number of children: 1   Years of education: Not on file   Highest education level: Not on file  Occupational History   Occupation: Retired from Science writer  Tobacco Use   Smoking status: Never   Smokeless tobacco: Never  Vaping Use   Vaping Use: Never used  Substance and Sexual Activity   Alcohol use: Yes    Alcohol/week: 1.0 - 2.0 standard drink of alcohol    Types: 1 - 2 Glasses of wine per week   Drug use: Never   Sexual activity: Not on file  Other Topics Concern   Not on file  Social History  Narrative   Not on file   Social Determinants of Health   Financial Resource Strain: Not on file  Food Insecurity: Not on file  Transportation Needs: Not on file  Physical Activity: Not on file  Stress: Not on file  Social Connections: Not on file  Intimate Partner Violence: Not on file    Family History  Problem Relation Age of Onset   Heart failure Mother    Heart Problems Paternal Grandfather    Colon cancer Neg Hx    Esophageal cancer Neg Hx     Review of Systems:  As stated in the HPI and otherwise negative.   BP 128/60   Pulse 85   Ht '5\' 7"'$  (1.702 m)   Wt 59.9 kg   SpO2 98%   BMI 20.67 kg/m   Physical Examination: General: Well  developed, well nourished, NAD  HEENT: OP clear, mucus membranes moist  SKIN: warm, dry. No rashes. Neuro: No focal deficits  Musculoskeletal: Muscle strength 5/5 all ext  Psychiatric: Mood and affect normal  Neck: No JVD, no carotid bruits, no thyromegaly, no lymphadenopathy.  Lungs:Clear bilaterally, no wheezes, rhonci, crackles Cardiovascular: Regular rate and rhythm. Loud, harsh, late peaking systolic murmur.  Abdomen:Soft. Bowel sounds present. Non-tender.  Extremities: No lower extremity edema. Pulses are 2 + in the bilateral DP/PT.  EKG:  EKG is not ordered today. The ekg from last week shows NSR  Echo 10/10/22: 1. Calcified aortic valve with severe AS (mean gradient 32 mmHg; AVA 0.8  cm2; DI 0.21). Compared to previous, AS is slightly worse.   2. Left ventricular ejection fraction, by estimation, is 60 to 65%. The  left ventricle has normal function. The left ventricle has no regional  wall motion abnormalities. Left ventricular diastolic parameters were  normal.   3. Right ventricular systolic function is normal. The right ventricular  size is normal.   4. The mitral valve is normal in structure. Mild mitral valve  regurgitation. No evidence of mitral stenosis.   5. The aortic valve is calcified. Aortic valve regurgitation is mild.  Severe aortic valve stenosis.   6. The inferior vena cava is normal in size with greater than 50%  respiratory variability, suggesting right atrial pressure of 3 mmHg.   FINDINGS   Left Ventricle: Left ventricular ejection fraction, by estimation, is 60  to 65%. The left ventricle has normal function. The left ventricle has no  regional wall motion abnormalities. The left ventricular internal cavity  size was normal in size. There is   no left ventricular hypertrophy. Left ventricular diastolic parameters  were normal.   Right Ventricle: The right ventricular size is normal. Right ventricular  systolic function is normal.   Left Atrium:  Left atrial size was normal in size.   Right Atrium: Right atrial size was normal in size.   Pericardium: There is no evidence of pericardial effusion.   Mitral Valve: The mitral valve is normal in structure. Mild mitral valve  regurgitation. No evidence of mitral valve stenosis.   Tricuspid Valve: The tricuspid valve is normal in structure. Tricuspid  valve regurgitation is mild . No evidence of tricuspid stenosis.   Aortic Valve: The aortic valve is calcified. Aortic valve regurgitation is  mild. Aortic regurgitation PHT measures 346 msec. Severe aortic stenosis  is present. Aortic valve mean gradient measures 32.0 mmHg. Aortic valve  peak gradient measures 48.5 mmHg.  Aortic valve area, by VTI measures 0.78 cm.   Pulmonic Valve: The pulmonic valve was  normal in structure. Pulmonic valve  regurgitation is not visualized. No evidence of pulmonic stenosis.   Aorta: The aortic root is normal in size and structure.   Venous: The inferior vena cava is normal in size with greater than 50%  respiratory variability, suggesting right atrial pressure of 3 mmHg.   IAS/Shunts: No atrial level shunt detected by color flow Doppler.   Additional Comments: Calcified aortic valve with severe AS (mean gradient  32 mmHg; AVA 0.8 cm2; DI 0.21). Compared to previous, AS is slightly  worse.     LEFT VENTRICLE  PLAX 2D  LVIDd:         3.70 cm   Diastology  LVIDs:         2.00 cm   LV e' medial:    8.16 cm/s  LV PW:         1.00 cm   LV E/e' medial:  7.9  LV IVS:        1.00 cm   LV e' lateral:   10.18 cm/s  LVOT diam:     2.20 cm   LV E/e' lateral: 6.3  LV SV:         67  LV SV Index:   40  LVOT Area:     3.80 cm     RIGHT VENTRICLE             IVC  RV Basal diam:  3.50 cm     IVC diam: 1.60 cm  RV S prime:     16.50 cm/s  TAPSE (M-mode): 2.3 cm   LEFT ATRIUM             Index        RIGHT ATRIUM           Index  LA diam:        3.50 cm 2.08 cm/m   RA Area:     10.20 cm  LA Vol  (A2C):   47.5 ml 28.21 ml/m  RA Volume:   23.10 ml  13.72 ml/m  LA Vol (A4C):   28.8 ml 17.10 ml/m  LA Biplane Vol: 40.8 ml 24.23 ml/m   AORTIC VALVE  AV Area (Vmax):    0.84 cm  AV Area (Vmean):   0.77 cm  AV Area (VTI):     0.78 cm  AV Vmax:           348.20 cm/s  AV Vmean:          268.800 cm/s  AV VTI:            0.853 m  AV Peak Grad:      48.5 mmHg  AV Mean Grad:      32.0 mmHg  LVOT Vmax:         77.15 cm/s  LVOT Vmean:        54.600 cm/s  LVOT VTI:          0.176 m  LVOT/AV VTI ratio: 0.21  AI PHT:            346 msec    AORTA  Ao Root diam: 3.40 cm  Ao Asc diam:  3.00 cm   MITRAL VALVE               TRICUSPID VALVE  MV Area (PHT): 3.59 cm    TR Peak grad:   22.8 mmHg  MV Decel Time: 212 msec    TR Vmax:  239.00 cm/s  MV E velocity: 64.35 cm/s  MV A velocity: 73.25 cm/s  SHUNTS  MV E/A ratio:  0.88        Systemic VTI:  0.18 m                             Systemic Diam: 2.20 cm   Recent Labs: 11/02/2022: ALT 23; BUN 19; Creatinine, Ser 1.18; Hemoglobin 15.2; Platelets 163; Potassium 3.9; Sodium 141   Lipid Panel    Component Value Date/Time   CHOL 129 09/19/2016 0756   TRIG 79 09/19/2016 0756   HDL 49 09/19/2016 0756   CHOLHDL 2.6 09/19/2016 0756   VLDL 16 09/19/2016 0756   LDLCALC 64 09/19/2016 0756     Wt Readings from Last 3 Encounters:  12/18/22 59.9 kg  12/09/22 60.3 kg  11/02/22 59.4 kg    Assessment and Plan:   1. Severe Aortic Valve Stenosis: He has severe, stage D3 (paradoxical low flow/low gradient) aortic valve stenosis. NYHA class 2. I have personally reviewed the echo images. The aortic valve is thickened, calcified with limited leaflet mobility. I think he would benefit from AVR. Given advanced age, he is not a good candidate for conventional AVR by surgical approach. I think he may be a good candidate for TAVR.   I have reviewed the natural history of aortic stenosis with the patient and their family members  who are present  today. We have discussed the limitations of medical therapy and the poor prognosis associated with symptomatic aortic stenosis. We have reviewed potential treatment options, including palliative medical therapy, conventional surgical aortic valve replacement, and transcatheter aortic valve replacement. We discussed treatment options in the context of the patient's specific comorbid medical conditions.   He would like to proceed with planning for TAVR. I will arrange a right and left heart catheterization at [facility] 12/26/22 at 9am.. Risks and benefits of the cath procedure and the valve procedure are reviewed with the patient. After the cath, he will have a cardiac CT, CTA of the chest/abdomen and pelvis and will then be referred to see one of the CT surgeons on our TAVR team.     Labs/ tests ordered today include:   Orders Placed This Encounter  Procedures   Basic metabolic panel   CBC   Disposition:   F/U with the valve team.   Signed, Lauree Chandler, MD, [facility] 12/18/2022 3:09 PM    [address], [place], [place]  [address] Phone: [phone]; Fax: [phone]

## 2022-12-18 NOTE — Progress Notes (Addendum)
Pre Surgical Assessment: 5 M Walk Test  56M=16.70f  5 Meter Walk Test- trial 1: 3.43 seconds 5 Meter Walk Test- trial 2: 3.70 seconds 5 Meter Walk Test- trial 3: 3.58 seconds 5 Meter Walk Test Average: 3.57 seconds    STS operative Risk Score:   Isolated AVR  Procedure Type: Isolated AVR PERIOPERATIVE OUTCOME ESTIMATE % Operative Mortality 1.6% Morbidity & Mortality 7.87% Stroke 1.31% Renal Failure 0.958% Reoperation 5.12% Prolonged Ventilation 3.13% Deep Sternal Wound Infection 0.023% LHinds HospitalStay (>14 days) 3.86% Short Hospital Stay (<6 days)* 49.1%

## 2022-12-18 NOTE — Patient Instructions (Signed)
Medication Instructions:  No changes *If you need a refill on your cardiac medications before your next appointment, please call your pharmacy*   Lab Work: Today: cbc, bmet   Testing/Procedures: Your physician has requested that you have a cardiac catheterization. Cardiac catheterization is used to diagnose and/or treat various heart conditions. Doctors may recommend this procedure for a number of different reasons. The most common reason is to evaluate chest pain. Chest pain can be a symptom of coronary artery disease (CAD), and cardiac catheterization can show whether plaque is narrowing or blocking your heart's arteries. This procedure is also used to evaluate the valves, as well as measure the blood flow and oxygen levels in different parts of your heart. For further information please visit [email or web address]. Please follow instruction sheet, as given.   Follow-Up: Per Structural Heart Team         Cardiac/Peripheral Catheterization   You are scheduled for a Cardiac Catheterization on Thursday, February 8 with Dr. Lauree Chandler.  1. Please arrive at the Main Entrance A at [facility]: [place], [place] [address] on February 8 at 7:00 AM (This time is two hours before your procedure to ensure your preparation). Free valet parking service is available. You will check in at ADMITTING. The support person will be asked to wait in the waiting room.  It is OK to have someone drop you off and come back when you are ready to be discharged.        Special note: Every effort is made to have your procedure done on time. Please understand that emergencies sometimes delay scheduled procedures.   . 2. Diet: Do not eat solid foods after midnight.  You may have clear liquids until 5 AM the day of the procedure.  3. Labs: You will need to have blood drawn on. You do not need to be fasting.  4. Medication instructions in preparation for your procedure:   Contrast  Allergy: No  On the morning of your procedure, take Aspirin 81 mg and any morning medicines NOT listed above.  You may use sips of water.  5. Plan to go home the same day, you will only stay overnight if medically necessary. 6. You MUST have a responsible adult to drive you home. 7. An adult MUST be with you the first 24 hours after you arrive home. 8. Bring a current list of your medications, and the last time and date medication taken. 9. Bring ID and current insurance cards. 10.Please wear clothes that are easy to get on and off and wear slip-on shoes.  Thank you for allowing [place] to care for you!   -- [facility]

## 2022-12-24 ENCOUNTER — Telehealth: Payer: Self-pay | Admitting: *Deleted

## 2022-12-24 NOTE — Telephone Encounter (Signed)
Cardiac Catheterization scheduled at [facility] for: Thursday December 26, 2022 9 AM Arrival time and place: [facility] Main Entrance A at: 7 AM  Nothing to eat after midnight prior to procedure, clear liquids until 5 AM day of procedure.  Medication instructions: -Usual morning medications can be taken with sips of water including aspirin 81 mg.  Confirmed patient has responsible adult to drive home post procedure and be with patient first 24 hours after arriving home.  Patient reports no new symptoms concerning for COVID-19 in the past 10 days.  Reviewed procedure instructions with patient.

## 2022-12-25 ENCOUNTER — Other Ambulatory Visit: Payer: Self-pay

## 2022-12-25 DIAGNOSIS — I350 Nonrheumatic aortic (valve) stenosis: Secondary | ICD-10-CM

## 2022-12-25 MED ORDER — METOPROLOL TARTRATE 50 MG PO TABS: ORAL_TABLET | ORAL | 0 refills | Status: DC

## 2022-12-26 ENCOUNTER — Ambulatory Visit (HOSPITAL_COMMUNITY)
Admission: RE | Admit: 2022-12-26 | Discharge: 2022-12-26 | Disposition: A | Discharge status: Home / Self Care | Payer: Medicare HMO | Attending: Cardiovascular Disease | Admitting: Cardiovascular Disease

## 2022-12-26 ENCOUNTER — Other Ambulatory Visit: Payer: Self-pay

## 2022-12-26 ENCOUNTER — Encounter (HOSPITAL_COMMUNITY)
Admission: RE | Disposition: A | Discharge status: Home / Self Care | Payer: Self-pay | Source: Home / Self Care | Attending: Cardiovascular Disease

## 2022-12-26 DIAGNOSIS — K219 Gastro-esophageal reflux disease without esophagitis: Secondary | ICD-10-CM | POA: Insufficient documentation

## 2022-12-26 DIAGNOSIS — I350 Nonrheumatic aortic (valve) stenosis: Secondary | ICD-10-CM | POA: Diagnosis present

## 2022-12-26 DIAGNOSIS — E785 Hyperlipidemia, unspecified: Secondary | ICD-10-CM | POA: Insufficient documentation

## 2022-12-26 DIAGNOSIS — I2510 Atherosclerotic heart disease of native coronary artery without angina pectoris: Secondary | ICD-10-CM | POA: Diagnosis not present

## 2022-12-26 HISTORY — PX: RIGHT HEART CATH AND CORONARY ANGIOGRAPHY: CATH118264

## 2022-12-26 LAB — POCT I-STAT 7, (LYTES, BLD GAS, ICA,H+H)
Acid-base deficit: 3 mmol/L — ABNORMAL HIGH (ref 0.0–2.0)
Bicarbonate: 21.8 mmol/L (ref 20.0–28.0)
Calcium, Ion: 1.06 mmol/L — ABNORMAL LOW (ref 1.15–1.40)
HCT: 38 % — ABNORMAL LOW (ref 39.0–52.0)
Hemoglobin: 12.9 g/dL — ABNORMAL LOW (ref 13.0–17.0)
O2 Saturation: 98 %
Potassium: 3.8 mmol/L (ref 3.5–5.1)
Sodium: 143 mmol/L (ref 135–145)
TCO2: 23 mmol/L (ref 22–32)
pCO2 arterial: 37.3 mmHg (ref 32–48)
pH, Arterial: 7.374 (ref 7.35–7.45)
pO2, Arterial: 100 mmHg (ref 83–108)

## 2022-12-26 LAB — POCT I-STAT EG7
Acid-base deficit: 2 mmol/L (ref 0.0–2.0)
Bicarbonate: 23.9 mmol/L (ref 20.0–28.0)
Calcium, Ion: 1.1 mmol/L — ABNORMAL LOW (ref 1.15–1.40)
HCT: 38 % — ABNORMAL LOW (ref 39.0–52.0)
Hemoglobin: 12.9 g/dL — ABNORMAL LOW (ref 13.0–17.0)
O2 Saturation: 76 %
Potassium: 3.7 mmol/L (ref 3.5–5.1)
Sodium: 145 mmol/L (ref 135–145)
TCO2: 25 mmol/L (ref 22–32)
pCO2, Ven: 43.4 mmHg — ABNORMAL LOW (ref 44–60)
pH, Ven: 7.349 (ref 7.25–7.43)
pO2, Ven: 43 mmHg (ref 32–45)

## 2022-12-26 SURGERY — RIGHT HEART CATH AND CORONARY ANGIOGRAPHY
Anesthesia: LOCAL

## 2022-12-26 MED ORDER — SODIUM CHLORIDE 0.9 % IV SOLN: INTRAVENOUS | Status: DC

## 2022-12-26 MED ORDER — SODIUM CHLORIDE 0.9% FLUSH: 3.0000 mL | Freq: Two times a day (BID) | INTRAVENOUS | Status: DC

## 2022-12-26 MED ORDER — HEPARIN SODIUM (PORCINE) 1000 UNIT/ML IJ SOLN
INTRAMUSCULAR | Status: DC | PRN
  Administered 2022-12-26: 3000 [IU] via INTRAVENOUS

## 2022-12-26 MED ORDER — SODIUM CHLORIDE 0.9% FLUSH: 3.0000 mL | INTRAVENOUS | Status: DC | PRN

## 2022-12-26 MED ORDER — SODIUM CHLORIDE 0.9 % IV SOLN: 250.0000 mL | INTRAVENOUS | Status: DC | PRN

## 2022-12-26 MED ORDER — SODIUM CHLORIDE 0.9 % WEIGHT BASED INFUSION
3.0000 mL/kg/h | INTRAVENOUS | Status: AC
  Administered 2022-12-26: 3 mL/kg/h via INTRAVENOUS

## 2022-12-26 MED ORDER — HYDRALAZINE HCL 20 MG/ML IJ SOLN: 10.0000 mg | INTRAMUSCULAR | Status: DC | PRN

## 2022-12-26 MED ORDER — LABETALOL HCL 5 MG/ML IV SOLN: 10.0000 mg | INTRAVENOUS | Status: DC | PRN

## 2022-12-26 MED ORDER — ONDANSETRON HCL 4 MG/2ML IJ SOLN: 4.0000 mg | Freq: Four times a day (QID) | INTRAMUSCULAR | Status: DC | PRN

## 2022-12-26 MED ORDER — ACETAMINOPHEN 325 MG PO TABS: 650.0000 mg | ORAL_TABLET | ORAL | Status: DC | PRN

## 2022-12-26 MED ORDER — VERAPAMIL HCL 2.5 MG/ML IV SOLN
INTRAVENOUS | Status: DC | PRN
  Administered 2022-12-26: 10 mL via INTRA_ARTERIAL

## 2022-12-26 MED ORDER — MIDAZOLAM HCL 2 MG/2ML IJ SOLN
INTRAMUSCULAR | Status: DC | PRN
  Administered 2022-12-26: 1 mg via INTRAVENOUS

## 2022-12-26 MED ORDER — SODIUM CHLORIDE 0.9 % WEIGHT BASED INFUSION: 1.0000 mL/kg/h | INTRAVENOUS | Status: DC

## 2022-12-26 MED ORDER — LIDOCAINE HCL (PF) 1 % IJ SOLN
INTRAMUSCULAR | Status: AC
  Filled 2022-12-26: qty 30

## 2022-12-26 MED ORDER — HEPARIN (PORCINE) IN NACL 1000-0.9 UT/500ML-% IV SOLN
INTRAVENOUS | Status: AC
  Filled 2022-12-26: qty 1000

## 2022-12-26 MED ORDER — HEPARIN (PORCINE) IN NACL 1000-0.9 UT/500ML-% IV SOLN
INTRAVENOUS | Status: DC | PRN
  Administered 2022-12-26 (×2): 500 mL

## 2022-12-26 MED ORDER — FENTANYL CITRATE (PF) 100 MCG/2ML IJ SOLN
INTRAMUSCULAR | Status: AC
  Filled 2022-12-26: qty 2

## 2022-12-26 MED ORDER — VERAPAMIL HCL 2.5 MG/ML IV SOLN
INTRAVENOUS | Status: AC
  Filled 2022-12-26: qty 2

## 2022-12-26 MED ORDER — ASPIRIN 81 MG PO CHEW: 81.0000 mg | CHEWABLE_TABLET | ORAL | Status: DC

## 2022-12-26 MED ORDER — HEPARIN SODIUM (PORCINE) 1000 UNIT/ML IJ SOLN
INTRAMUSCULAR | Status: AC
  Filled 2022-12-26: qty 10

## 2022-12-26 MED ORDER — MIDAZOLAM HCL 2 MG/2ML IJ SOLN
INTRAMUSCULAR | Status: AC
  Filled 2022-12-26: qty 2

## 2022-12-26 MED ORDER — LIDOCAINE HCL (PF) 1 % IJ SOLN
INTRAMUSCULAR | Status: DC | PRN
  Administered 2022-12-26 (×3): 2 mL via INTRADERMAL

## 2022-12-26 MED ORDER — FENTANYL CITRATE (PF) 100 MCG/2ML IJ SOLN
INTRAMUSCULAR | Status: DC | PRN
  Administered 2022-12-26: 25 ug via INTRAVENOUS

## 2022-12-26 SURGICAL SUPPLY — 14 items
CATH 5FR JL3.5 JR4 ANG PIG MP (CATHETERS) IMPLANT
CATH BALLN WEDGE 5F 110CM (CATHETERS) IMPLANT
DEVICE RAD COMP TR BAND LRG (VASCULAR PRODUCTS) IMPLANT
GLIDESHEATH SLEND SS 6F .021 (SHEATH) IMPLANT
GUIDEWIRE .025 260CM (WIRE) IMPLANT
GUIDEWIRE INQWIRE 1.5J.035X260 (WIRE) IMPLANT
INQWIRE 1.5J .035X260CM (WIRE) ×1
KIT HEART LEFT (KITS) ×1 IMPLANT
PACK CARDIAC CATHETERIZATION (CUSTOM PROCEDURE TRAY) ×1 IMPLANT
SHEATH GLIDE SLENDER 4/5FR (SHEATH) IMPLANT
SYR MEDRAD MARK 7 150ML (SYRINGE) ×1 IMPLANT
TRANSDUCER W/STOPCOCK (MISCELLANEOUS) ×1 IMPLANT
TUBING CIL FLEX 10 FLL-RA (TUBING) ×1 IMPLANT
WIRE HI TORQ VERSACORE-J 145CM (WIRE) IMPLANT

## 2022-12-26 NOTE — Interval H&P Note (Signed)
History and Physical Interval Note:  12/26/2022 7:32 AM  Peter Potts  has presented today for surgery, with the diagnosis of aortic stenosis.  The various methods of treatment have been discussed with the patient and family. After consideration of risks, benefits and other options for treatment, the patient has consented to  Procedure(s): RIGHT/LEFT HEART CATH AND CORONARY ANGIOGRAPHY (N/A) as a surgical intervention.  The patient's history has been reviewed, patient examined, no change in status, stable for surgery.  I have reviewed the patient's chart and labs.  Questions were answered to the patient's satisfaction.    Cath Lab Visit (complete for each Cath Lab visit)  Clinical Evaluation Leading to the Procedure:   ACS: No.  Non-ACS:    Anginal Classification: CCS I  Anti-ischemic medical therapy: No Therapy  Non-Invasive Test Results: No non-invasive testing performed  Prior CABG: No previous CABG        Lauree Chandler

## 2022-12-26 NOTE — Progress Notes (Signed)
Patient procedural site looks intact. No sign of bleeding and hematoma. Education was provided to patient and wife.

## 2022-12-26 NOTE — Discharge Instructions (Signed)
Radial Site Care  This sheet gives you information about how to care for yourself after your procedure. Your health care provider may also give you more specific instructions. If you have problems or questions, contact your health care provider. What can I expect after the procedure? After the procedure, it is common to have: Bruising and tenderness at the catheter insertion area. Follow these instructions at home: Medicines Take over-the-counter and prescription medicines only as told by your health care provider. Insertion site care Follow instructions from your health care provider about how to take care of your insertion site. Make sure you: Wash your hands with soap and water before you remove your bandage (dressing). If soap and water are not available, use hand sanitizer. May remove dressing in 24 hours. Check your insertion site every day for signs of infection. Check for: Redness, swelling, or pain. Fluid or blood. Pus or a bad smell. Warmth. Do no take baths, swim, or use a hot tub for 5 days. You may shower 24-48 hours after the procedure. Remove the dressing and gently wash the site with plain soap and water. Pat the area dry with a clean towel. Do not rub the site. That could cause bleeding. Do not apply powder or lotion to the site. Activity  For 24 hours after the procedure, or as directed by your health care provider: Do not flex or bend the affected arm. Do not push or pull heavy objects with the affected arm. Do not drive yourself home from the hospital or clinic. You may drive 24 hours after the procedure. Do not operate machinery or power tools. KEEP ARM ELEVATED THE REMAINDER OF THE DAY. Do not push, pull or lift anything that is heavier than 10 lb for 5 days. Ask your health care provider when it is okay to: Return to work or school. Resume usual physical activities or sports. Resume sexual activity. General instructions If the catheter site starts to  bleed, raise your arm and put firm pressure on the site. If the bleeding does not stop, get help right away. This is a medical emergency. DRINK PLENTY OF FLUIDS FOR THE NEXT 2-3 DAYS. No alcohol consumption for 24 hours after receiving sedation. If you went home on the same day as your procedure, a responsible adult should be with you for the first 24 hours after you arrive home. Keep all follow-up visits as told by your health care provider. This is important. Contact a health care provider if: You have a fever. You have redness, swelling, or yellow drainage around your insertion site. Get help right away if: You have unusual pain at the radial site. The catheter insertion area swells very fast. The insertion area is bleeding, and the bleeding does not stop when you hold steady pressure on the area. Your arm or hand becomes pale, cool, tingly, or numb. These symptoms may represent a serious problem that is an emergency. Do not wait to see if the symptoms will go away. Get medical help right away. Call your local emergency services (911 in the [place] Do not drive yourself to the hospital. Summary After the procedure, it is common to have bruising and tenderness at the site. Follow instructions from your health care provider about how to take care of your radial site wound. Check the wound every day for signs of infection.  This information is not intended to replace advice given to you by your health care provider. Make sure you discuss any questions you have with   your health care provider. Document Revised: 12/10/2017 Document Reviewed: 12/10/2017 Elsevier Patient Education  [place].   Brachial Site Care   This sheet gives you information about how to care for yourself after your procedure. Your health care provider may also give you more specific instructions. If you have problems or questions, contact your health care provider. What can I expect after the procedure? After the  procedure, it is common to have: Bruising and tenderness at the catheter insertion area. Follow these instructions at home:  Insertion site care Follow instructions from your health care provider about how to take care of your insertion site. Make sure you: Wash your hands with soap and water before you change your bandage (dressing). If soap and water are not available, use hand sanitizer. Remove your dressing as told by your health care provider. In 24 hours Check your insertion site every day for signs of infection. Check for: Redness, swelling, or pain. Pus or a bad smell. Warmth. You may shower 24-48 hours after the procedure. Do not apply powder or lotion to the site.  Activity For 24 hours after the procedure, or as directed by your health care provider: Do not push or pull heavy objects with the affected arm. Do not drive yourself home from the hospital or clinic. You may drive 24 hours after the procedure unless your health care provider tells you not to. Do not lift anything that is heavier than 10 lb (4.5 kg), or the limit that you are told, until your health care provider says that it is safe.  For 24 hours

## 2022-12-27 ENCOUNTER — Encounter (HOSPITAL_COMMUNITY): Payer: Self-pay | Admitting: Cardiovascular Disease

## 2023-01-01 ENCOUNTER — Ambulatory Visit (HOSPITAL_COMMUNITY): Payer: Medicare HMO

## 2023-01-06 ENCOUNTER — Ambulatory Visit (HOSPITAL_COMMUNITY)
Admission: RE | Admit: 2023-01-06 | Discharge: 2023-01-06 | Disposition: A | Discharge status: Home / Self Care | Payer: Medicare HMO | Source: Ambulatory Visit | Attending: Cardiovascular Disease | Admitting: Cardiovascular Disease

## 2023-01-06 DIAGNOSIS — I350 Nonrheumatic aortic (valve) stenosis: Secondary | ICD-10-CM

## 2023-01-06 MED ORDER — IOHEXOL 350 MG/ML SOLN
100.0000 mL | Freq: Once | INTRAVENOUS | Status: AC | PRN
  Administered 2023-01-06: 100 mL via INTRAVENOUS

## 2023-01-09 ENCOUNTER — Institutional Professional Consult (permissible substitution): Payer: Medicare HMO | Admitting: Cardiothoracic Surgery

## 2023-01-09 VITALS — BP 136/80 | HR 72 | Resp 20 | Ht 67.0 in | Wt 132.0 lb

## 2023-01-09 DIAGNOSIS — I350 Nonrheumatic aortic (valve) stenosis: Secondary | ICD-10-CM

## 2023-01-09 NOTE — Progress Notes (Signed)
LM 40% and further LAD lesion.   Talk about on Tuesday meeting.         [address]       [place] [address]             [phone]                    Zebbie Hooker Swarm Potts [facility] Medical Record [id number] Date of Birth: [birth year]  Referring: Jerline Pain, MD Primary Care: Rikki Spearing, NP Primary Cardiologist: Candee Furbish, MD  Chief Complaint:    Chief Complaint  Patient presents with   Aortic Stenosis    Surgical eval for TAVR/ review all tesing    History of Present Illness:    Peter Potts 82 y.o. male who presents for TAVR evaluation. He did  endorse to me that he is limited by DOE and says he is not doing same activities he was 5 years ago.   Per recent cardiology HPI.  82 yo male with history of CAD, GERD, carotid artery disease, hyperlipidemia and aortic stenosis here today as a new consult, referred by Dr. Marlou Porch, for further discussion regarding his aortic stenosis. He has evidence of CAD by coronary calcium scoring. He saw Dr. Marlou Porch recently and described occasional resting chest pain, fatigue and dyspnea. Echo 10/08/22 with LVEF=60-65%. Mild mitral regurgitation. Severe paradoxical low flow/low gradient aortic valve stenosis with mean gradient of 32 mmHg, peak gradient 48 mmHg, AVA 0.77 cm2, DI 0.21, SVI 40. On my personal review of the echo images, thee appears to be severe aortic stenosis. The valve leaflets are thickened/calcified and do not open well.    He tells me today that he has progressive fatigue. He plays pickleball 4 times per week. His energy level is down over the past year.  No chest pain. He has some dizziness but no syncope. No LE edema. e lives with his wife in [place], [place] in a retirement community. He is retired from [employer]. He has no active dental issues.       Past Medical History:  Diagnosis Date   Aortic stenosis    CAD (coronary artery disease)    GERD (gastroesophageal reflux disease)    Heart  murmur    Kidney stones     Past Surgical History:  Procedure Laterality Date   COLON RESECTION  2011   Dr Merleen Milliner resection - (22 x 3.5 cm), mesentery (6.0 x 1.5cm) diverticulitis   COLONOSCOPY  10/28/2014   Mild neosigmoid diverticulosis. Internal hemorrhoids. Otherwise normal colonoscopy to terminal ileum   RIGHT HEART CATH AND CORONARY ANGIOGRAPHY N/A 12/26/2022   Procedure: RIGHT HEART CATH AND CORONARY ANGIOGRAPHY;  Surgeon: Burnell Blanks, MD;  Location: [facility];  Service: Cardiovascular;  Laterality: N/A;    Family History  Problem Relation Age of Onset   Heart failure Mother    Heart Problems Paternal Grandfather    Colon cancer Neg Hx    Esophageal cancer Neg Hx      Social History   Tobacco Use  Smoking Status Never  Smokeless Tobacco Never    Social History   Substance and Sexual Activity  Alcohol Use Yes   Alcohol/week: 1.0 - 2.0 standard drink of alcohol   Types: 1 - 2 Glasses of wine per week     No Known Allergies  Current Outpatient Medications  Medication Sig Dispense Refill   aspirin EC 81 MG tablet Take 81 mg by  mouth at bedtime.     atorvastatin (LIPITOR) 40 MG tablet TAKE 1 TABLET BY MOUTH EVERY DAY 90 tablet 1   cetirizine (ZYRTEC) 10 MG tablet Take 1 tablet (10 mg total) by mouth daily. (Patient taking differently: Take 10 mg by mouth at bedtime.) 90 tablet 2   cholecalciferol (VITAMIN D3) 25 MCG (1000 UNIT) tablet Take 1,000 Units by mouth in the morning and at bedtime.     Coenzyme Q10 (COQ10) 100 MG CAPS Take 100 mg by mouth in the morning.     hypromellose (GENTEAL) 0.3 % GEL ophthalmic ointment Place 1 Application into both eyes at bedtime. Systane Gel     Multiple Vitamins-Minerals (ZINC PO) Take 50 mg by mouth in the morning.     Omega-3 Fatty Acids (FISH OIL PO) Take 1,200 mg by mouth in the morning.     omeprazole (PRILOSEC) 20 MG capsule Take 1 capsule (20 mg total) by mouth daily. Please call [phone]  to schedule an office visit for more refills (Patient taking differently: Take 20 mg by mouth daily as needed (indigestion/heartburn.). Please call [phone] to schedule an office visit for more refills) 90 capsule 1   Polyethyl Glycol-Propyl Glycol (SYSTANE ULTRA) 0.4-0.3 % SOLN Place 1-2 drops into both eyes 3 (three) times daily as needed (dry/irritated eyes.).     Probiotic Product (PROBIOTIC PO) Take 1 capsule by mouth in the morning.     zolpidem (AMBIEN) 10 MG tablet Take 10 mg by mouth as needed (sleep (while on vacations ONLY)).  0   acetaminophen (TYLENOL) 500 MG tablet Take 500-1,000 mg by mouth every 6 (six) hours as needed (pain.).     metoprolol tartrate (LOPRESSOR) 50 MG tablet Take one table by mouth prior to CT scan as directed 1 tablet 0   No current facility-administered medications for this visit.    ROS 14 point ROS reviewed and negative except as per HPI   PHYSICAL EXAMINATION: BP 136/80   Pulse 72   Resp 20   Ht '5\' 7"'$  (1.702 m)   Wt 132 lb (59.9 kg)   SpO2 97% Comment: RA  BMI 20.67 kg/m   Gen: NAD Neuro: Alert and oriented Resp: Nonlaboured Abd: Soft, ntnd Extr: WWP  Diagnostic Studies & Laboratory data:     Recent Radiology Findings:   CT CORONARY MORPH W/CTA COR W/SCORE W/CA W/CM &/OR WO/CM  Addendum Date: 01/06/2023   ADDENDUM REPORT: 01/06/2023 14:22 EXAM: OVER-READ INTERPRETATION  CT CHEST The following report is an over-read performed by radiologist Dr. Samara Snide [facility] on 01/06/2023. This over-read does not include interpretation of cardiac or coronary anatomy or pathology. The cardiac CTA interpretation by the cardiologist is attached. COMPARISON:  10/07/2017 coronary CT. FINDINGS: Please see the separate concurrent chest CT angiogram report for details. IMPRESSION: Please see the separate concurrent chest CT angiogram report for details. Electronically Signed   By: Ilona Sorrel M.D.   On: 01/06/2023 14:22   Result Date:  01/06/2023 CLINICAL DATA:  58M with severe aortic stenosis being evaluated for a TAVR procedure. EXAM: Cardiac TAVR CT TECHNIQUE: The patient was scanned on a [employer]. A 120 kV retrospective scan was triggered in the descending thoracic aorta at 111 HU's. Gantry rotation speed was 250 msecs and collimation was .6 mm. No beta blockade or nitro were given. The 3D data set was reconstructed in 5% intervals of the R-R cycle. Systolic and diastolic phases were analyzed on a dedicated work station using MPR, MIP  and VRT modes. The patient received 100 cc of contrast. FINDINGS: Aortic Root: Aortic valve: Trileaflet Aortic valve calcium score: 1479 Aortic annulus: Diameter: 82m x 210mPerimeter: 7764mrea: 465 mm^2 Calcifications: No calcifications Coronary height: Min Left - 56m72min Right - 17mm95motubular height: Left cusp - 21mm;52mht cusp - 23mm; 15moronary cusp - 20mm LV10mas measured 3 mm below the annulus): Diameter: 27mm x 276mArea57m1 mm^2 Calcifications: No calcifications Aortic sinus width: Left cusp - 32mm; Righ75msp - 29mm; Nonco69mry cusp - 31mm Sinotub22m junction width: 26mm x 26mm O61mum F56moscopic Angle for Delivery: LAU 13 CAU 13 Cardiac: Right atrium: Mild enlargement Right ventricle: Mild enlargement Pulmonary arteries: Normal size Pulmonary veins: Normal configuration Left atrium: Mild enlargement Left ventricle: Normal size Pericardium: Normal thickness Coronary arteries: Normal origins.  Coronary calcium score 748 IMPRESSION: 1. Trileaflet aortic valve with moderate calcifications (AV calcium score 1479) 2. Aortic annulus measures 27mm x 23mm in 22meter51mh perimeter 77mm and area 46558m2. No annular or LVOT calcifications. Annular measurements are suitable for delivery of a 26mm Edwards Sapie47mvalve 3. Sufficient coronary to annulus distance. 4. Optimum Fluoroscopic Angle for Delivery:  LAU 13 CAU 13 5. Coronary calcium score 748 (64th percentile) Electronically  Signed: By: Christopher  SchumaOswaldo Milian4 13:56   CT ANGIO ABDOMEN PELVIS  W &/OR WO CONTRAST  Result Date: 01/06/2023 CLINICAL DATA:  Aortic valve replacement (TAVR), pre-op eval. Nonrheumatic aortic valve stenosis. EXAM: CT ANGIOGRAPHY CHEST, ABDOMEN AND PELVIS TECHNIQUE: Multidetector CT imaging through the chest, abdomen and pelvis was performed using the standard protocol during bolus administration of intravenous contrast. Multiplanar reconstructed images and MIPs were obtained and reviewed to evaluate the vascular anatomy. RADIATION DOSE REDUCTION: This exam was performed according to the departmental dose-optimization program which includes automated exposure control, adjustment of the mA and/or kV according to patient size and/or use of iterative reconstruction technique. CONTRAST:  100mL OMNIPAQUE IOHE23m350 MG/ML SOLN COMPARISON:  11/02/2022 unenhanced CT abdomen/pelvis. 10/07/2017 coronary CT. FINDINGS: CTA CHEST FINDINGS Cardiovascular: Normal heart size. No significant pericardial effusion/thickening. Diffuse thickening and coarse calcification of the aortic valve. Three-vessel coronary atherosclerosis. Atherosclerotic nonaneurysmal thoracic aorta. Normal caliber pulmonary arteries. No central pulmonary emboli. Mediastinum/Nodes: No significant thyroid nodules. Unremarkable esophagus. No pathologically enlarged axillary, mediastinal or hilar lymph nodes. Lungs/Pleura: No pneumothorax. No pleural effusion. No acute consolidative airspace disease or lung masses. Minimal patchy tree-in-bud type opacities in the peripheral basilar lower lobes compatible with nonspecific minimal bronchiolitis. No bronchiectasis. No significant pulmonary nodules. Musculoskeletal: No aggressive appearing focal osseous lesions. Mild thoracic spondylosis. CTA ABDOMEN AND PELVIS FINDINGS Hepatobiliary: Normal liver with no liver mass. Normal gallbladder with no radiopaque cholelithiasis. No biliary ductal  dilatation. Pancreas: Normal, with no mass or duct dilation. Spleen: Normal size. No mass. Adrenals/Urinary Tract: Normal adrenals. No hydronephrosis. Scattered simple renal cysts, largest 7.8 cm in the anterior lower left kidney with additional scattered subcentimeter hypodense bilateral renal cortical lesions that are too small to characterize, for which no follow-up imaging is recommended. Normal bladder. Stomach/Bowel: Normal non-distended stomach. Normal caliber small bowel with no small bowel wall thickening. Normal appendix. Postsurgical changes from partial distal colectomy with intact appearing distal colonic anastomosis. Scattered mild colonic diverticulosis with no large bowel wall thickening or significant pericolonic fat stranding. Vascular/Lymphatic: Atherosclerotic nonaneurysmal abdominal aorta. No pathologically enlarged lymph nodes in the abdomen or pelvis. Reproductive: Top-normal size prostate. Other: No pneumoperitoneum, ascites or focal fluid collection. Musculoskeletal: No aggressive appearing focal osseous  lesions. Mild lumbar spondylosis. VASCULAR MEASUREMENTS PERTINENT TO TAVR: AORTA: Minimal Aortic Diameter-14.6 x 14.5 mm Severity of Aortic Calcification-mild RIGHT PELVIS: Right Common Iliac Artery - Minimal Diameter-9.3 x 8.6 mm Tortuosity-mild Calcification-mild Right External Iliac Artery - Minimal Diameter-7.6 x 7.6 mm Tortuosity-mild-to-moderate Calcification-none Right Common Femoral Artery - Minimal Diameter-9.0 x 7.7 mm Tortuosity-mild Calcification-mild LEFT PELVIS: Left Common Iliac Artery - Minimal Diameter-9.1 x 9.0 mm Tortuosity-mild Calcification-mild Left External Iliac Artery - Minimal Diameter-7.6 x 7.1 mm Tortuosity-mild Calcification-none Left Common Femoral Artery - Minimal Diameter-8.5 x 8.4 mm Tortuosity-mild Calcification-none Review of the MIP images confirms the above findings. IMPRESSION: 1. Vascular findings and measurements pertinent to potential TAVR procedure,  as detailed. 2. Diffuse thickening and coarse calcification of the aortic valve, compatible with the reported history of aortic stenosis. 3. Three-vessel coronary atherosclerosis. 4. Mild colonic diverticulosis. 5. Minimal patchy tree-in-bud type opacities in the peripheral basilar lower lobes compatible with nonspecific minimal bronchiolitis. 6.  Aortic Atherosclerosis (ICD10-I70.0). Electronically Signed   By: Ilona Sorrel M.D.   On: 01/06/2023 14:19   CT ANGIO CHEST AORTA W/CM & OR WO/CM  Result Date: 01/06/2023 CLINICAL DATA:  Aortic valve replacement (TAVR), pre-op eval. Nonrheumatic aortic valve stenosis. EXAM: CT ANGIOGRAPHY CHEST, ABDOMEN AND PELVIS TECHNIQUE: Multidetector CT imaging through the chest, abdomen and pelvis was performed using the standard protocol during bolus administration of intravenous contrast. Multiplanar reconstructed images and MIPs were obtained and reviewed to evaluate the vascular anatomy. RADIATION DOSE REDUCTION: This exam was performed according to the departmental dose-optimization program which includes automated exposure control, adjustment of the mA and/or kV according to patient size and/or use of iterative reconstruction technique. CONTRAST:  180m OMNIPAQUE IOHEXOL 350 MG/ML SOLN COMPARISON:  11/02/2022 unenhanced CT abdomen/pelvis. 10/07/2017 coronary CT. FINDINGS: CTA CHEST FINDINGS Cardiovascular: Normal heart size. No significant pericardial effusion/thickening. Diffuse thickening and coarse calcification of the aortic valve. Three-vessel coronary atherosclerosis. Atherosclerotic nonaneurysmal thoracic aorta. Normal caliber pulmonary arteries. No central pulmonary emboli. Mediastinum/Nodes: No significant thyroid nodules. Unremarkable esophagus. No pathologically enlarged axillary, mediastinal or hilar lymph nodes. Lungs/Pleura: No pneumothorax. No pleural effusion. No acute consolidative airspace disease or lung masses. Minimal patchy tree-in-bud type opacities  in the peripheral basilar lower lobes compatible with nonspecific minimal bronchiolitis. No bronchiectasis. No significant pulmonary nodules. Musculoskeletal: No aggressive appearing focal osseous lesions. Mild thoracic spondylosis. CTA ABDOMEN AND PELVIS FINDINGS Hepatobiliary: Normal liver with no liver mass. Normal gallbladder with no radiopaque cholelithiasis. No biliary ductal dilatation. Pancreas: Normal, with no mass or duct dilation. Spleen: Normal size. No mass. Adrenals/Urinary Tract: Normal adrenals. No hydronephrosis. Scattered simple renal cysts, largest 7.8 cm in the anterior lower left kidney with additional scattered subcentimeter hypodense bilateral renal cortical lesions that are too small to characterize, for which no follow-up imaging is recommended. Normal bladder. Stomach/Bowel: Normal non-distended stomach. Normal caliber small bowel with no small bowel wall thickening. Normal appendix. Postsurgical changes from partial distal colectomy with intact appearing distal colonic anastomosis. Scattered mild colonic diverticulosis with no large bowel wall thickening or significant pericolonic fat stranding. Vascular/Lymphatic: Atherosclerotic nonaneurysmal abdominal aorta. No pathologically enlarged lymph nodes in the abdomen or pelvis. Reproductive: Top-normal size prostate. Other: No pneumoperitoneum, ascites or focal fluid collection. Musculoskeletal: No aggressive appearing focal osseous lesions. Mild lumbar spondylosis. VASCULAR MEASUREMENTS PERTINENT TO TAVR: AORTA: Minimal Aortic Diameter-14.6 x 14.5 mm Severity of Aortic Calcification-mild RIGHT PELVIS: Right Common Iliac Artery - Minimal Diameter-9.3 x 8.6 mm Tortuosity-mild Calcification-mild Right External Iliac Artery - Minimal Diameter-7.6 x 7.6 mm Tortuosity-mild-to-moderate  Calcification-none Right Common Femoral Artery - Minimal Diameter-9.0 x 7.7 mm Tortuosity-mild Calcification-mild LEFT PELVIS: Left Common Iliac Artery - Minimal  Diameter-9.1 x 9.0 mm Tortuosity-mild Calcification-mild Left External Iliac Artery - Minimal Diameter-7.6 x 7.1 mm Tortuosity-mild Calcification-none Left Common Femoral Artery - Minimal Diameter-8.5 x 8.4 mm Tortuosity-mild Calcification-none Review of the MIP images confirms the above findings. IMPRESSION: 1. Vascular findings and measurements pertinent to potential TAVR procedure, as detailed. 2. Diffuse thickening and coarse calcification of the aortic valve, compatible with the reported history of aortic stenosis. 3. Three-vessel coronary atherosclerosis. 4. Mild colonic diverticulosis. 5. Minimal patchy tree-in-bud type opacities in the peripheral basilar lower lobes compatible with nonspecific minimal bronchiolitis. 6.  Aortic Atherosclerosis (ICD10-I70.0). Electronically Signed   By: Ilona Sorrel M.D.   On: 01/06/2023 14:19   CARDIAC CATHETERIZATION  Result Date: 12/26/2022   Prox RCA lesion is 20% stenosed.   Ost LM to Mid LM lesion is 40% stenosed.   Mid LAD lesion is 40% stenosed.   Dist LAD lesion is 30% stenosed. Non-obstructive CAD Normal right heart pressures. (RA 3, RV 33/0/8, PA 32/5 mean 16, PCWP 10, AO 112/55) Recommendations: Medical management of non-obstructive CAD. Continue workup for TAVR       I have independently reviewed the above radiology studies  and reviewed the findings with the patient.   Recent Lab Findings: Lab Results  Component Value Date   WBC 8.3 12/18/2022   HGB 12.9 (L) 12/26/2022   HCT 38.0 (L) 12/26/2022   PLT 159 12/18/2022   GLUCOSE 95 12/18/2022   CHOL 129 09/19/2016   TRIG 79 09/19/2016   HDL 49 09/19/2016   LDLCALC 64 09/19/2016   ALT 23 11/02/2022   AST 36 11/02/2022   NA 145 12/26/2022   K 3.7 12/26/2022   CL 105 12/18/2022   CREATININE 1.15 12/18/2022   BUN 18 12/18/2022   CO2 28 12/18/2022     Assessment / Plan:   Peter Potts 82 y.o. male referred in consultation for transcatheter aortic valve implantation  + Severe,  symptomatic aortic stenosis.  Meets criteria for AV replacement.  We talked about LM lesion 40% and with cardiology at [facility] conference and it was thought to be non-obstructive.    Risks/benefits/alternatives of TAVR were discussed at length (90% standard recovery, 6-9% morbidity [any organ, typically access site vascular complication needing surgery, stroke, or pacemaker] and <1% mortality. Options are TAVR, SAVR or medical treatment. Patient understands SAVR is typically higher risk and takes longer recovery than TAVR in elderly patients. As well, discussed that medical Tx yields around 50% mortality for symptomatic aortic stenosis in 2 years if left untreated, and this is an option.       Valve: 26 Sapien (Area 458) Bailout: yes  Access:  Transfemoral NYHA:II    Misc/procedural:   Peter Potts 01/17/2023 8:10 AM

## 2023-01-20 ENCOUNTER — Other Ambulatory Visit: Payer: Self-pay

## 2023-01-20 DIAGNOSIS — I350 Nonrheumatic aortic (valve) stenosis: Secondary | ICD-10-CM

## 2023-01-31 ENCOUNTER — Ambulatory Visit (HOSPITAL_COMMUNITY)
Admission: RE | Admit: 2023-01-31 | Discharge: 2023-01-31 | Disposition: A | Discharge status: Home / Self Care | Payer: Medicare HMO | Source: Ambulatory Visit | Attending: Cardiovascular Disease | Admitting: Cardiovascular Disease

## 2023-01-31 ENCOUNTER — Encounter (HOSPITAL_COMMUNITY)
Admission: RE | Admit: 2023-01-31 | Discharge: 2023-01-31 | Disposition: A | Discharge status: Home / Self Care | Payer: Medicare HMO | Source: Ambulatory Visit | Attending: Cardiovascular Disease | Admitting: Cardiovascular Disease

## 2023-01-31 ENCOUNTER — Other Ambulatory Visit: Payer: Self-pay

## 2023-01-31 DIAGNOSIS — Z1152 Encounter for screening for COVID-19: Secondary | ICD-10-CM | POA: Insufficient documentation

## 2023-01-31 DIAGNOSIS — Z01818 Encounter for other preprocedural examination: Secondary | ICD-10-CM | POA: Diagnosis not present

## 2023-01-31 DIAGNOSIS — I350 Nonrheumatic aortic (valve) stenosis: Secondary | ICD-10-CM | POA: Diagnosis not present

## 2023-01-31 LAB — URINALYSIS, ROUTINE W REFLEX MICROSCOPIC
Bilirubin Urine: NEGATIVE
Glucose, UA: NEGATIVE mg/dL
Hgb urine dipstick: NEGATIVE
Ketones, ur: NEGATIVE mg/dL
Leukocytes,Ua: NEGATIVE
Nitrite: NEGATIVE
Protein, ur: NEGATIVE mg/dL
Specific Gravity, Urine: 1.02 (ref 1.005–1.030)
pH: 5 (ref 5.0–8.0)

## 2023-01-31 LAB — COMPREHENSIVE METABOLIC PANEL
ALT: 18 U/L (ref 0–44)
AST: 29 U/L (ref 15–41)
Albumin: 3.6 g/dL (ref 3.5–5.0)
Alkaline Phosphatase: 83 U/L (ref 38–126)
Anion gap: 6 (ref 5–15)
BUN: 14 mg/dL (ref 8–23)
CO2: 26 mmol/L (ref 22–32)
Calcium: 9 mg/dL (ref 8.9–10.3)
Chloride: 107 mmol/L (ref 98–111)
Creatinine, Ser: 1.03 mg/dL (ref 0.61–1.24)
GFR, Estimated: >60 mL/min (ref >60)
Glucose, Bld: 90 mg/dL (ref 70–99)
Potassium: 4.1 mmol/L (ref 3.5–5.1)
Sodium: 139 mmol/L (ref 135–145)
Total Bilirubin: 1.5 mg/dL — ABNORMAL HIGH (ref 0.3–1.2)
Total Protein: 6.7 g/dL (ref 6.5–8.1)

## 2023-01-31 LAB — SURGICAL PCR SCREEN
MRSA, PCR: NEGATIVE
Staphylococcus aureus: NEGATIVE

## 2023-01-31 LAB — CBC
HCT: 48.6 % (ref 39.0–52.0)
Hemoglobin: 15.9 g/dL (ref 13.0–17.0)
MCH: 30.9 pg (ref 26.0–34.0)
MCHC: 32.7 g/dL (ref 30.0–36.0)
MCV: 94.6 fL (ref 80.0–100.0)
Platelets: 160 10*3/uL (ref 150–400)
RBC: 5.14 MIL/uL (ref 4.22–5.81)
RDW: 12.9 % (ref 11.5–15.5)
WBC: 5.6 10*3/uL (ref 4.0–10.5)
nRBC: 0 % (ref 0.0–0.2)

## 2023-01-31 LAB — TYPE AND SCREEN
ABO/RH(D): O POS
Antibody Screen: NEGATIVE

## 2023-01-31 LAB — PROTIME-INR
INR: 1.1 (ref 0.8–1.2)
Prothrombin Time: 13.7 seconds (ref 11.4–15.2)

## 2023-01-31 LAB — SARS CORONAVIRUS 2 (TAT 6-24 HRS): SARS Coronavirus 2: NEGATIVE

## 2023-01-31 NOTE — Progress Notes (Addendum)
Patient signed all consents at PAT lab appointment. CHG soap and instructions were given to patient. CHG surgical prep reviewed with patient and all questions answered.  Med Red not completed at time of appointment. Pt and pts wife given Pharmacy Call Center phone number and instructed to call today and review current medications. Pt and wife understood instructions.  Pt denies any respiratory illness/infection in the last two months.

## 2023-02-03 MED ORDER — HEPARIN 30,000 UNITS/1000 ML (OHS) CELLSAVER SOLUTION
Status: DC
  Filled 2023-02-03: qty 1000

## 2023-02-03 MED ORDER — MAGNESIUM SULFATE 50 % IJ SOLN
40.0000 meq | INTRAMUSCULAR | Status: DC
  Filled 2023-02-03: qty 9.85

## 2023-02-03 MED ORDER — NOREPINEPHRINE 4 MG/250ML-% IV SOLN
0.0000 ug/min | INTRAVENOUS | Status: AC
  Administered 2023-02-04: 2 ug/min via INTRAVENOUS
  Filled 2023-02-03: qty 250

## 2023-02-03 MED ORDER — CEFAZOLIN SODIUM-DEXTROSE 2-4 GM/100ML-% IV SOLN
2.0000 g | INTRAVENOUS | Status: AC
  Administered 2023-02-04: 2 g via INTRAVENOUS
  Filled 2023-02-03: qty 100

## 2023-02-03 MED ORDER — DEXMEDETOMIDINE HCL IN NACL 400 MCG/100ML IV SOLN
0.1000 ug/kg/h | INTRAVENOUS | Status: AC
  Administered 2023-02-04: 59.92 ug via INTRAVENOUS
  Filled 2023-02-03: qty 100

## 2023-02-03 MED ORDER — POTASSIUM CHLORIDE 2 MEQ/ML IV SOLN
80.0000 meq | INTRAVENOUS | Status: DC
  Filled 2023-02-03: qty 40

## 2023-02-03 NOTE — H&P (Signed)
[address]       [place] [address]             [phone]      Cardiothoracic Surgery Admission History and Physical   [place] Record [id number] Date of Birth: January 01, 1941   Referring: Jerline Pain, MD Primary Care: Rikki Spearing, NP Primary Cardiologist: Candee Furbish, MD   Chief Complaint:        Chief Complaint  Patient presents with   Aortic Stenosis           History of Present Illness:    Peter Potts 82 y.o. male who presents for TAVR evaluation. He did  endorse to me that he is limited by DOE and says he is not doing same activities he was 5 years ago.    Per recent cardiology HPI.  82 yo male with history of CAD, GERD, carotid artery disease, hyperlipidemia and aortic stenosis here today as a new consult, referred by Dr. Marlou Porch, for further discussion regarding his aortic stenosis. He has evidence of CAD by coronary calcium scoring. He saw Dr. Marlou Porch recently and described occasional resting chest pain, fatigue and dyspnea. Echo 10/08/22 with LVEF=60-65%. Mild mitral regurgitation. Severe paradoxical low flow/low gradient aortic valve stenosis with mean gradient of 32 mmHg, peak gradient 48 mmHg, AVA 0.77 cm2, DI 0.21, SVI 40. On my personal review of the echo images, thee appears to be severe aortic stenosis. The valve leaflets are thickened/calcified and do not open well.    He tells me today that he has progressive fatigue. He plays pickleball 4 times per week. His energy level is down over the past year.  No chest pain. He has some dizziness but no syncope. No LE edema. e lives with his wife in [place], [place] in a retirement community. He is retired from [employer]. He has no active dental issues.              Past Medical History:  Diagnosis Date   Aortic stenosis     CAD (coronary artery disease)     GERD (gastroesophageal reflux disease)     Heart murmur     Kidney stones             Past  Surgical History:  Procedure Laterality Date   COLON RESECTION   2011    Dr Merleen Milliner resection - (22 x 3.5 cm), mesentery (6.0 x 1.5cm) diverticulitis   COLONOSCOPY   10/28/2014    Mild neosigmoid diverticulosis. Internal hemorrhoids. Otherwise normal colonoscopy to terminal ileum   RIGHT HEART CATH AND CORONARY ANGIOGRAPHY N/A 12/26/2022    Procedure: RIGHT HEART CATH AND CORONARY ANGIOGRAPHY;  Surgeon: Burnell Blanks, MD;  Location: [facility];  Service: Cardiovascular;  Laterality: N/A;           Family History  Problem Relation Age of Onset   Heart failure Mother     Heart Problems Paternal Grandfather     Colon cancer Neg Hx     Esophageal cancer Neg Hx          Social History       Tobacco Use  Smoking Status Never  Smokeless Tobacco Never    Social History        Substance and Sexual Activity  Alcohol Use Yes   Alcohol/week: 1.0 - 2.0 standard drink of alcohol   Types: 1 - 2 Glasses of wine per  week        No Known Allergies         Current Outpatient Medications  Medication Sig Dispense Refill   aspirin EC 81 MG tablet Take 81 mg by mouth at bedtime.       atorvastatin (LIPITOR) 40 MG tablet TAKE 1 TABLET BY MOUTH EVERY DAY 90 tablet 1   cetirizine (ZYRTEC) 10 MG tablet Take 1 tablet (10 mg total) by mouth daily. (Patient taking differently: Take 10 mg by mouth at bedtime.) 90 tablet 2   cholecalciferol (VITAMIN D3) 25 MCG (1000 UNIT) tablet Take 1,000 Units by mouth in the morning and at bedtime.       Coenzyme Q10 (COQ10) 100 MG CAPS Take 100 mg by mouth in the morning.       hypromellose (GENTEAL) 0.3 % GEL ophthalmic ointment Place 1 Application into both eyes at bedtime. Systane Gel       Multiple Vitamins-Minerals (ZINC PO) Take 50 mg by mouth in the morning.       Omega-3 Fatty Acids (FISH OIL PO) Take 1,200 mg by mouth in the morning.       omeprazole (PRILOSEC) 20 MG capsule Take 1 capsule (20 mg total) by mouth daily. Please  call [phone] to schedule an office visit for more refills (Patient taking differently: Take 20 mg by mouth daily as needed (indigestion/heartburn.). Please call [phone] to schedule an office visit for more refills) 90 capsule 1   Polyethyl Glycol-Propyl Glycol (SYSTANE ULTRA) 0.4-0.3 % SOLN Place 1-2 drops into both eyes 3 (three) times daily as needed (dry/irritated eyes.).       Probiotic Product (PROBIOTIC PO) Take 1 capsule by mouth in the morning.       zolpidem (AMBIEN) 10 MG tablet Take 10 mg by mouth as needed (sleep (while on vacations ONLY)).   0   acetaminophen (TYLENOL) 500 MG tablet Take 500-1,000 mg by mouth every 6 (six) hours as needed (pain.).       metoprolol tartrate (LOPRESSOR) 50 MG tablet Take one table by mouth prior to CT scan as directed 1 tablet 0    No current facility-administered medications for this visit.      ROS 14 point ROS reviewed and negative except as per HPI     PHYSICAL EXAMINATION: BP 136/80   Pulse 72   Resp 20   Ht 5\' 7"  (1.702 m)   Wt 132 lb (59.9 kg)   SpO2 97% Comment: RA  BMI 20.67 kg/m    Gen: NAD Neuro: Alert and oriented Resp: Nonlaboured Abd: Soft, ntnd Extr: WWP   Diagnostic Studies & Laboratory data:     Recent Radiology Findings:    Imaging Results  CT CORONARY MORPH W/CTA COR W/SCORE W/CA W/CM &/OR WO/CM   Addendum Date: 01/06/2023   ADDENDUM REPORT: 01/06/2023 14:22 EXAM: OVER-READ INTERPRETATION  CT CHEST The following report is an over-read performed by radiologist Dr. Samara Snide [facility] on 01/06/2023. This over-read does not include interpretation of cardiac or coronary anatomy or pathology. The cardiac CTA interpretation by the cardiologist is attached. COMPARISON:  10/07/2017 coronary CT. FINDINGS: Please see the separate concurrent chest CT angiogram report for details. IMPRESSION: Please see the separate concurrent chest CT angiogram report for details. Electronically Signed   By: Ilona Sorrel M.D.   On: 01/06/2023 14:22    Result Date: 01/06/2023 CLINICAL DATA:  32M with severe aortic stenosis being evaluated for a TAVR procedure. EXAM: Cardiac TAVR CT  TECHNIQUE: The patient was scanned on a [employer]. A 120 kV retrospective scan was triggered in the descending thoracic aorta at 111 HU's. Gantry rotation speed was 250 msecs and collimation was .6 mm. No beta blockade or nitro were given. The 3D data set was reconstructed in 5% intervals of the R-R cycle. Systolic and diastolic phases were analyzed on a dedicated work station using MPR, MIP and VRT modes. The patient received 100 cc of contrast. FINDINGS: Aortic Root: Aortic valve: Trileaflet Aortic valve calcium score: 1479 Aortic annulus: Diameter: 58mm x 59mm Perimeter: 11mm Area: 465 mm^2 Calcifications: No calcifications Coronary height: Min Left - 89mm, Min Right - 4mm Sinotubular height: Left cusp - 68mm; Right cusp - 85mm; Noncoronary cusp - 99mm LVOT (as measured 3 mm below the annulus): Diameter: 69mm x 82mm Area: 441 mm^2 Calcifications: No calcifications Aortic sinus width: Left cusp - 57mm; Right cusp - 74mm; Noncoronary cusp - 22mm Sinotubular junction width: 21mm x 21mm Optimum Fluoroscopic Angle for Delivery: LAU 13 CAU 13 Cardiac: Right atrium: Mild enlargement Right ventricle: Mild enlargement Pulmonary arteries: Normal size Pulmonary veins: Normal configuration Left atrium: Mild enlargement Left ventricle: Normal size Pericardium: Normal thickness Coronary arteries: Normal origins.  Coronary calcium score 748 IMPRESSION: 1. Trileaflet aortic valve with moderate calcifications (AV calcium score 1479) 2. Aortic annulus measures 68mm x 76mm in diameter with perimeter 90mm and area 465 mm^2. No annular or LVOT calcifications. Annular measurements are suitable for delivery of a 44mm Edwards Sapien 3 valve 3. Sufficient coronary to annulus distance. 4. Optimum Fluoroscopic Angle for Delivery:  LAU 13 CAU 13 5. Coronary  calcium score 748 (64th percentile) Electronically Signed: By: Oswaldo Milian M.D. On: 01/06/2023 13:56    CT ANGIO ABDOMEN PELVIS  W &/OR WO CONTRAST   Result Date: 01/06/2023 CLINICAL DATA:  Aortic valve replacement (TAVR), pre-op eval. Nonrheumatic aortic valve stenosis. EXAM: CT ANGIOGRAPHY CHEST, ABDOMEN AND PELVIS TECHNIQUE: Multidetector CT imaging through the chest, abdomen and pelvis was performed using the standard protocol during bolus administration of intravenous contrast. Multiplanar reconstructed images and MIPs were obtained and reviewed to evaluate the vascular anatomy. RADIATION DOSE REDUCTION: This exam was performed according to the departmental dose-optimization program which includes automated exposure control, adjustment of the mA and/or kV according to patient size and/or use of iterative reconstruction technique. CONTRAST:  19mL OMNIPAQUE IOHEXOL 350 MG/ML SOLN COMPARISON:  11/02/2022 unenhanced CT abdomen/pelvis. 10/07/2017 coronary CT. FINDINGS: CTA CHEST FINDINGS Cardiovascular: Normal heart size. No significant pericardial effusion/thickening. Diffuse thickening and coarse calcification of the aortic valve. Three-vessel coronary atherosclerosis. Atherosclerotic nonaneurysmal thoracic aorta. Normal caliber pulmonary arteries. No central pulmonary emboli. Mediastinum/Nodes: No significant thyroid nodules. Unremarkable esophagus. No pathologically enlarged axillary, mediastinal or hilar lymph nodes. Lungs/Pleura: No pneumothorax. No pleural effusion. No acute consolidative airspace disease or lung masses. Minimal patchy tree-in-bud type opacities in the peripheral basilar lower lobes compatible with nonspecific minimal bronchiolitis. No bronchiectasis. No significant pulmonary nodules. Musculoskeletal: No aggressive appearing focal osseous lesions. Mild thoracic spondylosis. CTA ABDOMEN AND PELVIS FINDINGS Hepatobiliary: Normal liver with no liver mass. Normal gallbladder with  no radiopaque cholelithiasis. No biliary ductal dilatation. Pancreas: Normal, with no mass or duct dilation. Spleen: Normal size. No mass. Adrenals/Urinary Tract: Normal adrenals. No hydronephrosis. Scattered simple renal cysts, largest 7.8 cm in the anterior lower left kidney with additional scattered subcentimeter hypodense bilateral renal cortical lesions that are too small to characterize, for which no follow-up imaging is recommended. Normal bladder. Stomach/Bowel: Normal non-distended stomach.  Normal caliber small bowel with no small bowel wall thickening. Normal appendix. Postsurgical changes from partial distal colectomy with intact appearing distal colonic anastomosis. Scattered mild colonic diverticulosis with no large bowel wall thickening or significant pericolonic fat stranding. Vascular/Lymphatic: Atherosclerotic nonaneurysmal abdominal aorta. No pathologically enlarged lymph nodes in the abdomen or pelvis. Reproductive: Top-normal size prostate. Other: No pneumoperitoneum, ascites or focal fluid collection. Musculoskeletal: No aggressive appearing focal osseous lesions. Mild lumbar spondylosis. VASCULAR MEASUREMENTS PERTINENT TO TAVR: AORTA: Minimal Aortic Diameter-14.6 x 14.5 mm Severity of Aortic Calcification-mild RIGHT PELVIS: Right Common Iliac Artery - Minimal Diameter-9.3 x 8.6 mm Tortuosity-mild Calcification-mild Right External Iliac Artery - Minimal Diameter-7.6 x 7.6 mm Tortuosity-mild-to-moderate Calcification-none Right Common Femoral Artery - Minimal Diameter-9.0 x 7.7 mm Tortuosity-mild Calcification-mild LEFT PELVIS: Left Common Iliac Artery - Minimal Diameter-9.1 x 9.0 mm Tortuosity-mild Calcification-mild Left External Iliac Artery - Minimal Diameter-7.6 x 7.1 mm Tortuosity-mild Calcification-none Left Common Femoral Artery - Minimal Diameter-8.5 x 8.4 mm Tortuosity-mild Calcification-none Review of the MIP images confirms the above findings. IMPRESSION: 1. Vascular findings and  measurements pertinent to potential TAVR procedure, as detailed. 2. Diffuse thickening and coarse calcification of the aortic valve, compatible with the reported history of aortic stenosis. 3. Three-vessel coronary atherosclerosis. 4. Mild colonic diverticulosis. 5. Minimal patchy tree-in-bud type opacities in the peripheral basilar lower lobes compatible with nonspecific minimal bronchiolitis. 6.  Aortic Atherosclerosis (ICD10-I70.0). Electronically Signed   By: Ilona Sorrel M.D.   On: 01/06/2023 14:19    CT ANGIO CHEST AORTA W/CM & OR WO/CM   Result Date: 01/06/2023 CLINICAL DATA:  Aortic valve replacement (TAVR), pre-op eval. Nonrheumatic aortic valve stenosis. EXAM: CT ANGIOGRAPHY CHEST, ABDOMEN AND PELVIS TECHNIQUE: Multidetector CT imaging through the chest, abdomen and pelvis was performed using the standard protocol during bolus administration of intravenous contrast. Multiplanar reconstructed images and MIPs were obtained and reviewed to evaluate the vascular anatomy. RADIATION DOSE REDUCTION: This exam was performed according to the departmental dose-optimization program which includes automated exposure control, adjustment of the mA and/or kV according to patient size and/or use of iterative reconstruction technique. CONTRAST:  115mL OMNIPAQUE IOHEXOL 350 MG/ML SOLN COMPARISON:  11/02/2022 unenhanced CT abdomen/pelvis. 10/07/2017 coronary CT. FINDINGS: CTA CHEST FINDINGS Cardiovascular: Normal heart size. No significant pericardial effusion/thickening. Diffuse thickening and coarse calcification of the aortic valve. Three-vessel coronary atherosclerosis. Atherosclerotic nonaneurysmal thoracic aorta. Normal caliber pulmonary arteries. No central pulmonary emboli. Mediastinum/Nodes: No significant thyroid nodules. Unremarkable esophagus. No pathologically enlarged axillary, mediastinal or hilar lymph nodes. Lungs/Pleura: No pneumothorax. No pleural effusion. No acute consolidative airspace disease or  lung masses. Minimal patchy tree-in-bud type opacities in the peripheral basilar lower lobes compatible with nonspecific minimal bronchiolitis. No bronchiectasis. No significant pulmonary nodules. Musculoskeletal: No aggressive appearing focal osseous lesions. Mild thoracic spondylosis. CTA ABDOMEN AND PELVIS FINDINGS Hepatobiliary: Normal liver with no liver mass. Normal gallbladder with no radiopaque cholelithiasis. No biliary ductal dilatation. Pancreas: Normal, with no mass or duct dilation. Spleen: Normal size. No mass. Adrenals/Urinary Tract: Normal adrenals. No hydronephrosis. Scattered simple renal cysts, largest 7.8 cm in the anterior lower left kidney with additional scattered subcentimeter hypodense bilateral renal cortical lesions that are too small to characterize, for which no follow-up imaging is recommended. Normal bladder. Stomach/Bowel: Normal non-distended stomach. Normal caliber small bowel with no small bowel wall thickening. Normal appendix. Postsurgical changes from partial distal colectomy with intact appearing distal colonic anastomosis. Scattered mild colonic diverticulosis with no large bowel wall thickening or significant pericolonic fat stranding. Vascular/Lymphatic: Atherosclerotic nonaneurysmal abdominal  aorta. No pathologically enlarged lymph nodes in the abdomen or pelvis. Reproductive: Top-normal size prostate. Other: No pneumoperitoneum, ascites or focal fluid collection. Musculoskeletal: No aggressive appearing focal osseous lesions. Mild lumbar spondylosis. VASCULAR MEASUREMENTS PERTINENT TO TAVR: AORTA: Minimal Aortic Diameter-14.6 x 14.5 mm Severity of Aortic Calcification-mild RIGHT PELVIS: Right Common Iliac Artery - Minimal Diameter-9.3 x 8.6 mm Tortuosity-mild Calcification-mild Right External Iliac Artery - Minimal Diameter-7.6 x 7.6 mm Tortuosity-mild-to-moderate Calcification-none Right Common Femoral Artery - Minimal Diameter-9.0 x 7.7 mm Tortuosity-mild  Calcification-mild LEFT PELVIS: Left Common Iliac Artery - Minimal Diameter-9.1 x 9.0 mm Tortuosity-mild Calcification-mild Left External Iliac Artery - Minimal Diameter-7.6 x 7.1 mm Tortuosity-mild Calcification-none Left Common Femoral Artery - Minimal Diameter-8.5 x 8.4 mm Tortuosity-mild Calcification-none Review of the MIP images confirms the above findings. IMPRESSION: 1. Vascular findings and measurements pertinent to potential TAVR procedure, as detailed. 2. Diffuse thickening and coarse calcification of the aortic valve, compatible with the reported history of aortic stenosis. 3. Three-vessel coronary atherosclerosis. 4. Mild colonic diverticulosis. 5. Minimal patchy tree-in-bud type opacities in the peripheral basilar lower lobes compatible with nonspecific minimal bronchiolitis. 6.  Aortic Atherosclerosis (ICD10-I70.0). Electronically Signed   By: Ilona Sorrel M.D.   On: 01/06/2023 14:19    CARDIAC CATHETERIZATION   Result Date: 12/26/2022   Prox RCA lesion is 20% stenosed.   Ost LM to Mid LM lesion is 40% stenosed.   Mid LAD lesion is 40% stenosed.   Dist LAD lesion is 30% stenosed. Non-obstructive CAD Normal right heart pressures. (RA 3, RV 33/0/8, PA 32/5 mean 16, PCWP 10, AO 112/55) Recommendations: Medical management of non-obstructive CAD. Continue workup for TAVR           I have independently reviewed the above radiology studies  and reviewed the findings with the patient.    Recent Lab Findings: Recent Labs       Lab Results  Component Value Date    WBC 8.3 12/18/2022    HGB 12.9 (L) 12/26/2022    HCT 38.0 (L) 12/26/2022    PLT 159 12/18/2022    GLUCOSE 95 12/18/2022    CHOL 129 09/19/2016    TRIG 79 09/19/2016    HDL 49 09/19/2016    LDLCALC 64 09/19/2016    ALT 23 11/02/2022    AST 36 11/02/2022    NA 145 12/26/2022    K 3.7 12/26/2022    CL 105 12/18/2022    CREATININE 1.15 12/18/2022    BUN 18 12/18/2022    CO2 28 12/18/2022          Assessment / Plan:    Peter Potts 82 y.o. male referred in consultation for transcatheter aortic valve implantation   + Severe, symptomatic aortic stenosis.   Meets criteria for AV replacement.   We talked about LM lesion 40% and with cardiology at [facility] conference and it was thought to be non-obstructive.      Risks/benefits/alternatives of TAVR were discussed at length (90% standard recovery, 6-9% morbidity [any organ, typically access site vascular complication needing surgery, stroke, or pacemaker] and <1% mortality. Options are TAVR, SAVR or medical treatment. Patient understands SAVR is typically higher risk and takes longer recovery than TAVR in elderly patients. As well, discussed that medical Tx yields around 50% mortality for symptomatic aortic stenosis in 2 years if left untreated, and this is an option.       Valve: 26 Sapien (Area 458) Bailout: yes  Access:  Transfemoral NYHA:II    Misc/procedural:  Pierre Bali Enter

## 2023-02-04 ENCOUNTER — Inpatient Hospital Stay (HOSPITAL_COMMUNITY): Admit: 2023-02-04 | Payer: Medicare HMO | Admitting: Cardiovascular Disease

## 2023-02-04 ENCOUNTER — Other Ambulatory Visit: Payer: Self-pay | Admitting: Cardiology

## 2023-02-04 ENCOUNTER — Inpatient Hospital Stay (HOSPITAL_COMMUNITY): Payer: Medicare HMO | Admitting: Physician Assistant

## 2023-02-04 ENCOUNTER — Inpatient Hospital Stay (HOSPITAL_COMMUNITY): Payer: Medicare HMO

## 2023-02-04 ENCOUNTER — Encounter (HOSPITAL_COMMUNITY)
Admission: RE | Disposition: A | Discharge status: Home / Self Care | Payer: Self-pay | Source: Home / Self Care | Attending: Surgery

## 2023-02-04 ENCOUNTER — Inpatient Hospital Stay (HOSPITAL_COMMUNITY)
Admission: RE | Admit: 2023-02-04 | Discharge: 2023-02-05 | DRG: 267 | Disposition: A | Discharge status: Home / Self Care | Payer: Medicare HMO | Attending: Surgery | Admitting: Surgery

## 2023-02-04 ENCOUNTER — Encounter (HOSPITAL_COMMUNITY): Payer: Self-pay

## 2023-02-04 ENCOUNTER — Encounter (HOSPITAL_COMMUNITY): Payer: Self-pay | Admitting: Cardiovascular Disease

## 2023-02-04 DIAGNOSIS — Z954 Presence of other heart-valve replacement: Secondary | ICD-10-CM | POA: Diagnosis not present

## 2023-02-04 DIAGNOSIS — I2510 Atherosclerotic heart disease of native coronary artery without angina pectoris: Secondary | ICD-10-CM | POA: Diagnosis present

## 2023-02-04 DIAGNOSIS — Z952 Presence of prosthetic heart valve: Secondary | ICD-10-CM

## 2023-02-04 DIAGNOSIS — I350 Nonrheumatic aortic (valve) stenosis: Secondary | ICD-10-CM

## 2023-02-04 DIAGNOSIS — Z87442 Personal history of urinary calculi: Secondary | ICD-10-CM | POA: Diagnosis not present

## 2023-02-04 DIAGNOSIS — E7800 Pure hypercholesterolemia, unspecified: Secondary | ICD-10-CM | POA: Diagnosis present

## 2023-02-04 DIAGNOSIS — R011 Cardiac murmur, unspecified: Secondary | ICD-10-CM | POA: Diagnosis present

## 2023-02-04 DIAGNOSIS — E785 Hyperlipidemia, unspecified: Secondary | ICD-10-CM | POA: Diagnosis present

## 2023-02-04 DIAGNOSIS — I10 Essential (primary) hypertension: Secondary | ICD-10-CM | POA: Diagnosis present

## 2023-02-04 DIAGNOSIS — Z006 Encounter for examination for normal comparison and control in clinical research program: Secondary | ICD-10-CM

## 2023-02-04 DIAGNOSIS — Z8249 Family history of ischemic heart disease and other diseases of the circulatory system: Secondary | ICD-10-CM

## 2023-02-04 DIAGNOSIS — Z9049 Acquired absence of other specified parts of digestive tract: Secondary | ICD-10-CM

## 2023-02-04 DIAGNOSIS — K219 Gastro-esophageal reflux disease without esophagitis: Secondary | ICD-10-CM | POA: Diagnosis present

## 2023-02-04 DIAGNOSIS — Z79899 Other long term (current) drug therapy: Secondary | ICD-10-CM | POA: Diagnosis not present

## 2023-02-04 DIAGNOSIS — Z7982 Long term (current) use of aspirin: Secondary | ICD-10-CM | POA: Diagnosis not present

## 2023-02-04 DIAGNOSIS — I6529 Occlusion and stenosis of unspecified carotid artery: Secondary | ICD-10-CM | POA: Diagnosis present

## 2023-02-04 HISTORY — DX: Presence of prosthetic heart valve: Z95.2

## 2023-02-04 HISTORY — PX: INTRAOPERATIVE TRANSTHORACIC ECHOCARDIOGRAM: SHX6523

## 2023-02-04 HISTORY — PX: TRANSCATHETER AORTIC VALVE REPLACEMENT, TRANSFEMORAL: SHX6400

## 2023-02-04 LAB — POCT I-STAT, CHEM 8
BUN: 13 mg/dL (ref 8–23)
BUN: 14 mg/dL (ref 8–23)
Calcium, Ion: 1.17 mmol/L (ref 1.15–1.40)
Calcium, Ion: 1.23 mmol/L (ref 1.15–1.40)
Chloride: 106 mmol/L (ref 98–111)
Chloride: 107 mmol/L (ref 98–111)
Creatinine, Ser: 0.7 mg/dL (ref 0.61–1.24)
Creatinine, Ser: 0.8 mg/dL (ref 0.61–1.24)
Glucose, Bld: 113 mg/dL — ABNORMAL HIGH (ref 70–99)
Glucose, Bld: 104 mg/dL — ABNORMAL HIGH (ref 70–99)
HCT: 36 % — ABNORMAL LOW (ref 39.0–52.0)
HCT: 38 % — ABNORMAL LOW (ref 39.0–52.0)
Hemoglobin: 12.2 g/dL — ABNORMAL LOW (ref 13.0–17.0)
Hemoglobin: 12.9 g/dL — ABNORMAL LOW (ref 13.0–17.0)
Potassium: 3.5 mmol/L (ref 3.5–5.1)
Potassium: 3.9 mmol/L (ref 3.5–5.1)
Sodium: 142 mmol/L (ref 135–145)
Sodium: 142 mmol/L (ref 135–145)
TCO2: 24 mmol/L (ref 22–32)
TCO2: 23 mmol/L (ref 22–32)

## 2023-02-04 LAB — ECHOCARDIOGRAM LIMITED
AR max vel: 2.72 cm2
AV Area VTI: 2.99 cm2
AV Area mean vel: 2.81 cm2
AV Mean grad: 1 mmHg
AV Peak grad: 2.5 mmHg
Ao pk vel: 0.8 m/s

## 2023-02-04 LAB — ABO/RH: ABO/RH(D): O POS

## 2023-02-04 SURGERY — IMPLANTATION, AORTIC VALVE, TRANSCATHETER, FEMORAL APPROACH
Anesthesia: Monitor Anesthesia Care

## 2023-02-04 SURGERY — IMPLANTATION, AORTIC VALVE, TRANSCATHETER, FEMORAL APPROACH
Anesthesia: Monitor Anesthesia Care | Site: Chest

## 2023-02-04 MED ORDER — CHLORHEXIDINE GLUCONATE 0.12 % MT SOLN
15.0000 mL | Freq: Once | OROMUCOSAL | Status: DC
  Filled 2023-02-04: qty 15

## 2023-02-04 MED ORDER — SODIUM CHLORIDE 0.9% FLUSH: 3.0000 mL | INTRAVENOUS | Status: DC | PRN

## 2023-02-04 MED ORDER — LIDOCAINE 2% (20 MG/ML) 5 ML SYRINGE
INTRAMUSCULAR | Status: AC
  Filled 2023-02-04: qty 5

## 2023-02-04 MED ORDER — LIDOCAINE HCL 1 % IJ SOLN
INTRAMUSCULAR | Status: DC | PRN
  Administered 2023-02-04: 16 mL via INTRADERMAL

## 2023-02-04 MED ORDER — OXYCODONE HCL 5 MG PO TABS: 5.0000 mg | ORAL_TABLET | ORAL | Status: DC | PRN

## 2023-02-04 MED ORDER — ACETAMINOPHEN 650 MG RE SUPP: 650.0000 mg | Freq: Four times a day (QID) | RECTAL | Status: DC | PRN

## 2023-02-04 MED ORDER — PROTAMINE SULFATE 10 MG/ML IV SOLN
INTRAVENOUS | Status: DC | PRN
  Administered 2023-02-04: 90 mg via INTRAVENOUS

## 2023-02-04 MED ORDER — SODIUM CHLORIDE 0.9 % IV SOLN: INTRAVENOUS | Status: DC

## 2023-02-04 MED ORDER — CEFAZOLIN SODIUM-DEXTROSE 2-4 GM/100ML-% IV SOLN
2.0000 g | Freq: Three times a day (TID) | INTRAVENOUS | Status: AC
  Administered 2023-02-04 – 2023-02-05 (×2): 2 g via INTRAVENOUS
  Filled 2023-02-04 (×2): qty 100

## 2023-02-04 MED ORDER — CHLORHEXIDINE GLUCONATE 4 % EX LIQD: 30.0000 mL | CUTANEOUS | Status: DC

## 2023-02-04 MED ORDER — LACTATED RINGERS IV SOLN: INTRAVENOUS | Status: DC

## 2023-02-04 MED ORDER — ONDANSETRON HCL 4 MG/2ML IJ SOLN
INTRAMUSCULAR | Status: AC
  Filled 2023-02-04: qty 2

## 2023-02-04 MED ORDER — CHLORHEXIDINE GLUCONATE 4 % EX LIQD: 60.0000 mL | Freq: Once | CUTANEOUS | Status: DC

## 2023-02-04 MED ORDER — ONDANSETRON HCL 4 MG/2ML IJ SOLN: 4.0000 mg | Freq: Four times a day (QID) | INTRAMUSCULAR | Status: DC | PRN

## 2023-02-04 MED ORDER — SODIUM CHLORIDE 0.9 % IV SOLN: INTRAVENOUS | Status: AC

## 2023-02-04 MED ORDER — ACETAMINOPHEN 325 MG PO TABS
650.0000 mg | ORAL_TABLET | Freq: Four times a day (QID) | ORAL | Status: DC | PRN
  Administered 2023-02-05: 650 mg via ORAL
  Filled 2023-02-04: qty 2

## 2023-02-04 MED ORDER — CHLORHEXIDINE GLUCONATE 0.12 % MT SOLN
15.0000 mL | OROMUCOSAL | Status: AC
  Administered 2023-02-04: 15 mL via OROMUCOSAL

## 2023-02-04 MED ORDER — LIDOCAINE HCL 1 % IJ SOLN
INTRAMUSCULAR | Status: AC
  Filled 2023-02-04: qty 20

## 2023-02-04 MED ORDER — ARTIFICIAL TEARS OPHTHALMIC OINT
1.0000 | TOPICAL_OINTMENT | Freq: Every day | OPHTHALMIC | Status: DC
  Administered 2023-02-04: 1 via OPHTHALMIC
  Filled 2023-02-04: qty 3.5

## 2023-02-04 MED ORDER — SODIUM CHLORIDE 0.9 % IV SOLN: 250.0000 mL | INTRAVENOUS | Status: DC | PRN

## 2023-02-04 MED ORDER — PROPOFOL 500 MG/50ML IV EMUL
INTRAVENOUS | Status: DC | PRN
  Administered 2023-02-04: 20 ug/kg/min via INTRAVENOUS

## 2023-02-04 MED ORDER — NITROGLYCERIN IN D5W 200-5 MCG/ML-% IV SOLN
0.0000 ug/min | INTRAVENOUS | Status: DC
  Filled 2023-02-04: qty 250

## 2023-02-04 MED ORDER — ZOLPIDEM TARTRATE 5 MG PO TABS
5.0000 mg | ORAL_TABLET | Freq: Every evening | ORAL | Status: DC | PRN
  Administered 2023-02-04: 5 mg via ORAL
  Filled 2023-02-04: qty 1

## 2023-02-04 MED ORDER — HEPARIN SODIUM (PORCINE) 1000 UNIT/ML IJ SOLN
INTRAMUSCULAR | Status: DC | PRN
  Administered 2023-02-04: 9000 [IU] via INTRAVENOUS

## 2023-02-04 MED ORDER — ATORVASTATIN CALCIUM 40 MG PO TABS
40.0000 mg | ORAL_TABLET | Freq: Every day | ORAL | Status: DC
  Administered 2023-02-04: 40 mg via ORAL
  Filled 2023-02-04: qty 1

## 2023-02-04 MED ORDER — SODIUM CHLORIDE 0.9% FLUSH
3.0000 mL | Freq: Two times a day (BID) | INTRAVENOUS | Status: DC
  Administered 2023-02-04 – 2023-02-05 (×2): 3 mL via INTRAVENOUS

## 2023-02-04 MED ORDER — IODIXANOL 320 MG/ML IV SOLN
INTRAVENOUS | Status: DC | PRN
  Administered 2023-02-04: 41.6 mL via INTRA_ARTERIAL

## 2023-02-04 MED ORDER — ACETAMINOPHEN 500 MG PO TABS
1000.0000 mg | ORAL_TABLET | Freq: Once | ORAL | Status: AC
  Administered 2023-02-04: 1000 mg via ORAL
  Filled 2023-02-04: qty 2

## 2023-02-04 MED ORDER — ONDANSETRON HCL 4 MG/2ML IJ SOLN
INTRAMUSCULAR | Status: DC | PRN
  Administered 2023-02-04: 4 mg via INTRAVENOUS

## 2023-02-04 MED ORDER — MORPHINE SULFATE (PF) 2 MG/ML IV SOLN
1.0000 mg | INTRAVENOUS | Status: DC | PRN
  Administered 2023-02-04: 2 mg via INTRAVENOUS
  Filled 2023-02-04: qty 1

## 2023-02-04 MED ORDER — SODIUM CHLORIDE 0.9 % IV SOLN: 250.0000 mL | INTRAVENOUS | Status: DC

## 2023-02-04 MED ORDER — HEPARIN 6000 UNIT IRRIGATION SOLUTION
Status: AC
  Filled 2023-02-04: qty 1500

## 2023-02-04 MED ORDER — LORATADINE 10 MG PO TABS
10.0000 mg | ORAL_TABLET | Freq: Every day | ORAL | Status: DC
  Administered 2023-02-05: 10 mg via ORAL
  Filled 2023-02-04: qty 1

## 2023-02-04 MED ORDER — TRAMADOL HCL 50 MG PO TABS: 50.0000 mg | ORAL_TABLET | ORAL | Status: DC | PRN

## 2023-02-04 MED ORDER — PROPOFOL 1000 MG/100ML IV EMUL
INTRAVENOUS | Status: AC
  Filled 2023-02-04: qty 100

## 2023-02-04 MED ORDER — ASPIRIN 81 MG PO TBEC: 81.0000 mg | DELAYED_RELEASE_TABLET | Freq: Every day | ORAL | Status: DC

## 2023-02-04 MED ORDER — LACTATED RINGERS IV SOLN: INTRAVENOUS | Status: DC | PRN

## 2023-02-04 MED ORDER — HEPARIN 6000 UNIT IRRIGATION SOLUTION
Status: DC | PRN
  Administered 2023-02-04: 3

## 2023-02-04 SURGICAL SUPPLY — 66 items
ADH SKN CLS APL DERMABOND .7 (GAUZE/BANDAGES/DRESSINGS) ×2
APL PRP STRL LF DISP 70% ISPRP (MISCELLANEOUS) ×4
BAG COUNTER SPONGE SURGICOUNT (BAG) ×2 IMPLANT
BAG DECANTER FOR FLEXI CONT (MISCELLANEOUS) IMPLANT
BAG SNAP BAND KOVER 36X36 (MISCELLANEOUS) IMPLANT
BAG SPNG CNTER NS LX DISP (BAG) ×2
BLADE CLIPPER SURG (BLADE) IMPLANT
BLADE OSCILLATING /SAGITTAL (BLADE) IMPLANT
BLADE STERNUM SYSTEM 6 (BLADE) IMPLANT
CABLE ADAPT CONN TEMP 6FT (ADAPTER) ×2 IMPLANT
CATH DIAG EXPO 6F AL1 (CATHETERS) IMPLANT
CATH DIAG EXPO 6F VENT PIG 145 (CATHETERS) ×4 IMPLANT
CATH INFINITI 6F AL2 (CATHETERS) IMPLANT
CATH S G BIP PACING (CATHETERS) ×2 IMPLANT
CHLORAPREP W/TINT 26 (MISCELLANEOUS) ×2 IMPLANT
CLOSURE MYNX CONTROL 6F/7F (Vascular Products) IMPLANT
CLOSURE PERCLOSE PROSTYLE (VASCULAR PRODUCTS) IMPLANT
CNTNR URN SCR LID CUP LEK RST (MISCELLANEOUS) ×4 IMPLANT
CONT SPEC 4OZ STRL OR WHT (MISCELLANEOUS) ×4
COVER BACK TABLE 80X110 HD (DRAPES) ×2 IMPLANT
DERMABOND ADVANCED .7 DNX12 (GAUZE/BANDAGES/DRESSINGS) ×2 IMPLANT
DEVICE CLOSURE PERCLS PRGLD 6F (VASCULAR PRODUCTS) ×4 IMPLANT
DRSG TEGADERM 4X4.75 (GAUZE/BANDAGES/DRESSINGS) ×4 IMPLANT
ELECT REM PT RETURN 9FT ADLT (ELECTROSURGICAL) ×2
ELECTRODE REM PT RTRN 9FT ADLT (ELECTROSURGICAL) ×2 IMPLANT
GAUZE SPONGE 4X4 12PLY STRL (GAUZE/BANDAGES/DRESSINGS) ×2 IMPLANT
GLOVE BIO SURGEON STRL SZ7.5 (GLOVE) ×2 IMPLANT
GLOVE BIOGEL PI IND STRL 6.5 (GLOVE) IMPLANT
GLOVE ECLIPSE 7.0 STRL STRAW (GLOVE) ×2 IMPLANT
GOWN STRL REUS W/ TWL LRG LVL3 (GOWN DISPOSABLE) IMPLANT
GOWN STRL REUS W/ TWL XL LVL3 (GOWN DISPOSABLE) ×2 IMPLANT
GOWN STRL REUS W/TWL LRG LVL3 (GOWN DISPOSABLE)
GOWN STRL REUS W/TWL XL LVL3 (GOWN DISPOSABLE) ×2
KIT BASIN OR (CUSTOM PROCEDURE TRAY) ×2 IMPLANT
KIT HEART LEFT (KITS) ×2 IMPLANT
KIT SAPIAN 3 ULTRA RESILIA 26 (Valve) IMPLANT
KIT TURNOVER KIT B (KITS) ×2 IMPLANT
NS IRRIG 1000ML POUR BTL (IV SOLUTION) ×2 IMPLANT
PACK ENDO MINOR (CUSTOM PROCEDURE TRAY) ×2 IMPLANT
PAD ARMBOARD 7.5X6 YLW CONV (MISCELLANEOUS) ×4 IMPLANT
PAD ELECT DEFIB RADIOL ZOLL (MISCELLANEOUS) ×2 IMPLANT
PERCLOSE PROGLIDE 6F (VASCULAR PRODUCTS) ×4
POSITIONER HEAD DONUT 9IN (MISCELLANEOUS) ×2 IMPLANT
SET MICROPUNCTURE 5F STIFF (MISCELLANEOUS) ×2 IMPLANT
SHEATH BRITE TIP 7FR 35CM (SHEATH) ×2 IMPLANT
SHEATH PINNACLE 6F 10CM (SHEATH) ×2 IMPLANT
SHEATH PINNACLE 8F 10CM (SHEATH) ×2 IMPLANT
SLEEVE REPOSITIONING LENGTH 30 (MISCELLANEOUS) ×2 IMPLANT
SPIKE FLUID TRANSFER (MISCELLANEOUS) ×2 IMPLANT
SPONGE T-LAP 18X18 ~~LOC~~+RFID (SPONGE) ×2 IMPLANT
STOPCOCK MORSE 400PSI 3WAY (MISCELLANEOUS) ×4 IMPLANT
SUT PROLENE 6 0 C 1 30 (SUTURE) IMPLANT
SUT SILK  1 MH (SUTURE) ×2
SUT SILK 1 MH (SUTURE) ×2 IMPLANT
SYR 50ML LL SCALE MARK (SYRINGE) ×2 IMPLANT
SYR BULB IRRIG 60ML STRL (SYRINGE) IMPLANT
TOWEL GREEN STERILE (TOWEL DISPOSABLE) ×4 IMPLANT
TRANSDUCER W/STOPCOCK (MISCELLANEOUS) ×4 IMPLANT
TUBING ART PRESS 72  MALE/FEM (TUBING) ×2
TUBING ART PRESS 72 MALE/FEM (TUBING) IMPLANT
TUBING CIL FLEX 10 FLL-RA (TUBING) IMPLANT
WIRE AMPLATZ SS-J .035X180CM (WIRE) IMPLANT
WIRE EMERALD 3MM-J .035X150CM (WIRE) ×2 IMPLANT
WIRE EMERALD 3MM-J .035X260CM (WIRE) ×2 IMPLANT
WIRE EMERALD ST .035X260CM (WIRE) ×4 IMPLANT
WIRE SAFARI SM CURVE 275 (WIRE) IMPLANT

## 2023-02-04 NOTE — CV Procedure (Signed)
[facility]  TAVR OPERATIVE NOTE   Date of Procedure:  02/04/2023  Preoperative Diagnosis: Severe Aortic Stenosis   Postoperative Diagnosis: Same   Procedure:   Transcatheter Aortic Valve Replacement - Transfemoral Approach  Edwards Sapien 3 THV (size 26 mm, model #[id number] , serial [id number]  )   Co-Surgeons:  Lauree Chandler, MD and Gilford Raid , MD   Anesthesiologist:  Ola Spurr  Echocardiographer:  Johnsie Cancel  Pre-operative Echo Findings: Severe aortic stenosis Normal left ventricular systolic function  Post-operative Echo Findings: No paravalvular leak Normal left ventricular systolic function  BRIEF CLINICAL NOTE AND INDICATIONS FOR SURGERY  82 yo male with history of CAD, GERD, carotid artery disease, hyperlipidemia and aortic stenosis here today for TAVR. He has had chest pain, fatigue and dyspnea. Echo 10/08/22 with LVEF=60-65%. Mild mitral regurgitation. Severe paradoxical low flow/low gradient aortic valve stenosis with mean gradient of 32 mmHg, peak gradient 48 mmHg, AVA 0.77 cm2, DI 0.21, SVI 40. On my personal review the valve leaflets are thickened/calcified and do not open well. Cardiac cath with mild non-obstructive CAD.   During the course of the patient's preoperative work up they have been evaluated comprehensively by a multidisciplinary team of specialists coordinated through the [facility] in the [facility].  They have been demonstrated to suffer from symptomatic severe aortic stenosis as noted above. The patient has been counseled extensively as to the relative risks and benefits of all options for the treatment of severe aortic stenosis including long term medical therapy, conventional surgery for aortic valve replacement, and transcatheter aortic valve replacement.  The patient has been independently evaluated by Dr. Cyndia Bent with CT surgery and they are felt to be at high risk for  conventional surgical aortic valve replacement. The surgeon indicated the patient would be a poor candidate for conventional surgery. Based upon review of all of the patient's preoperative diagnostic tests they are felt to be candidate for transcatheter aortic valve replacement using the transfemoral approach as an alternative to high risk conventional surgery.    Following the decision to proceed with transcatheter aortic valve replacement, a discussion has been held regarding what types of management strategies would be attempted intraoperatively in the event of life-threatening complications, including whether or not the patient would be considered a candidate for the use of cardiopulmonary bypass and/or conversion to open sternotomy for attempted surgical intervention.  The patient has been advised of a variety of complications that might develop peculiar to this approach including but not limited to risks of death, stroke, paravalvular leak, aortic dissection or other major vascular complications, aortic annulus rupture, device embolization, cardiac rupture or perforation, acute myocardial infarction, arrhythmia, heart block or bradycardia requiring permanent pacemaker placement, congestive heart failure, respiratory failure, renal failure, pneumonia, infection, other late complications related to structural valve deterioration or migration, or other complications that might ultimately cause a temporary or permanent loss of functional independence or other long term morbidity.  The patient provides full informed consent for the procedure as described and all questions were answered preoperatively.  DETAILS OF THE OPERATIVE PROCEDURE  PREPARATION:   The patient is brought to the operating room on the above mentioned date and central monitoring was established by the anesthesia team including placement of a radial arterial line. The patient is placed in the supine position on the operating table.   Intravenous antibiotics are administered. Conscious sedation is used.   Baseline transthoracic echocardiogram was performed. The patient's chest, abdomen, both  groins, and both lower extremities are prepared and draped in a sterile manner. A time out procedure is performed.   PERIPHERAL ACCESS:   Using the modified Seldinger technique, femoral arterial and venous access were obtained with placement of a 6 Fr sheath in the artery and a 7 Fr sheath in the vein on the left side using u/s guidance.  A pigtail diagnostic catheter was passed through the femoral arterial sheath under fluoroscopic guidance into the aortic root.  A temporary transvenous pacemaker catheter was passed through the femoral venous sheath under fluoroscopic guidance into the right ventricle.  The pacemaker was tested to ensure stable lead placement and pacemaker capture. Aortic root angiography was performed in order to determine the optimal angiographic angle for valve deployment.  TRANSFEMORAL ACCESS:  A micropuncture kit was used to gain access to the right femoral artery using u/s guidance. Position confirmed with angiography. Pre-closure with double ProGlide closure devices. The patient was heparinized systemically and ACT verified > 250 seconds.    A 14 Fr transfemoral E-sheath was introduced into the right femoral artery after progressively dilating over an Amplatz superstiff wire. An AL-2 catheter was used to direct a straight-tip exchange length wire across the native aortic valve into the left ventricle. This was exchanged out for a pigtail catheter and position was confirmed in the LV apex. Simultaneous LV and Ao pressures were recorded.  The pigtail catheter was then exchanged for a Safari wire in the LV apex.   TRANSCATHETER HEART VALVE DEPLOYMENT:  An Edwards Sapien 3 THV (size 26 mm) was prepared and crimped per manufacturer's guidelines, and the proper orientation of the valve is confirmed on the [employer]  delivery system. The valve was advanced through the introducer sheath using normal technique until in an appropriate position in the abdominal aorta beyond the sheath tip. The balloon was then retracted and using the fine-tuning wheel was centered on the valve. The valve was then advanced across the aortic arch using appropriate flexion of the catheter. The valve was carefully positioned across the aortic valve annulus. The Commander catheter was retracted using normal technique. Once final position of the valve has been confirmed by angiographic assessment, the valve is deployed while temporarily holding ventilation and during rapid ventricular pacing to maintain systolic blood pressure < 50 mmHg and pulse pressure < 10 mmHg. The balloon inflation is held for >3 seconds after reaching full deployment volume. Once the balloon has fully deflated the balloon is retracted into the ascending aorta and valve function is assessed using TTE. There is felt to be no paravalvular leak and no central aortic insufficiency.  The patient's hemodynamic recovery following valve deployment is good.  The deployment balloon and guidewire are both removed. Echo demostrated acceptable post-procedural gradients, stable mitral valve function, and no AI.   PROCEDURE COMPLETION:  The sheath was then removed and closure devices were completed. Protamine was administered once femoral arterial repair was complete. The temporary pacemaker, pigtail catheters and femoral sheaths were removed with a Mynx closure device placed in the artery and manual pressure used for venous hemostasis.    The patient tolerated the procedure well and is transported to the surgical intensive care in stable condition. There were no immediate intraoperative complications. All sponge instrument and needle counts are verified correct at completion of the operation.   No blood products were administered during the operation.  The patient received a total of  41.6 mL of intravenous contrast during the procedure.  LVEDP: 13 mmHg  Lauree Chandler MD, [facility] 02/04/2023 2:54 PM

## 2023-02-04 NOTE — Anesthesia Procedure Notes (Signed)
Arterial Line Insertion Start/End3/19/2024 9:34 AM, 02/04/2023 9:34 AM Performed by: Lavell Luster, CRNA  Preanesthetic checklist: patient identified, IV checked, site marked, risks and benefits discussed, surgical consent, monitors and equipment checked, pre-op evaluation and timeout performed Lidocaine 1% used for infiltration Right, radial was placed Catheter size: 20 G Hand hygiene performed , maximum sterile barriers used  and Seldinger technique used Allen's test indicative of satisfactory collateral circulation Attempts: 1 Procedure performed without using ultrasound guided technique. Following insertion, Biopatch and dressing applied. Post procedure assessment: normal

## 2023-02-04 NOTE — Discharge Summary (Signed)
[facility] VALVE TEAM  Discharge Summary    Patient ID: Peter Potts MRN: [id number]; DOB: 1941/08/30  Admit date: 02/04/2023 Discharge date: 02/05/2023  Primary Care Provider: Rikki Spearing, NP  Primary Cardiologist: Candee Furbish, MD / Dr. Angelena Form, MD & Dr. Cyndia Bent, MD (TAVR)  Discharge Diagnoses    Principal Problem:   S/P TAVR (transcatheter aortic valve replacement) Active Problems:   Carotid artery plaque   Coronary artery disease involving native coronary artery of native heart without angina pectoris   Pure hypercholesterolemia   Severe aortic stenosis   CAD (coronary artery disease)  Allergies No Known Allergies  Diagnostic Studies/Procedures    TAVR OPERATIVE NOTE     Date of Procedure:                02/04/2023   Preoperative Diagnosis:      Severe Aortic Stenosis    Postoperative Diagnosis:    Same    Procedure:        Transcatheter Aortic Valve Replacement - Percutaneous Right Transfemoral Approach             Edwards Sapien 3 Ultra Resilia THV (size 26 mm, model # [id number], serial # [id number])              Co-Surgeons:                        Gaye Pollack, MD and Lauree Chandler    Anesthesiologist:                  Suann Larry, MD   Echocardiographer:              Jenkins Rouge, MD   Pre-operative Echo Findings: Severe aortic stenosis Normal left ventricular systolic function   Post-operative Echo Findings: No paravalvular leak Normal left ventricular systolic function   ____________   Echo 02/05/23: Completed but pending formal read at the time of discharge   History of Present Illness     Peter Potts is a 82 y.o. male with a history of CAD, GERD, carotid artery disease, hyperlipidemia and severe aortic stenosis who presented to [facility] on 02/04/23 for planned TAVR.   Peter Potts is followed by Dr. Marlou Porch for his cardiology care. He was referred to Dr. Angelena Form for further  discussion of his AS after having chest pain, fatigue and dyspnea. Echocardiogram from 10/08/22 showed LVEF at 60-65% with mild MR, and severe paradoxical low flow/low gradient aortic valve stenosis with mean gradient of 32 mmHg, peak gradient 48 mmHg, AVA 0.77 cm2, DI 0.21, SVI 40.   Plan was for further workup with CT imaging and R/LHC. Cath showed pRCA  lesion at 20% stenosed, ostial LM to mLM lesion is 40% stenosed, mLAD lesion at 40% stenosed, dLAD lesion is 30% stenosed with normal right heart pressures RA 3, RV 33/0/8, PA 32/5 mean 16, PCWP 10, AO 112/55 with recommendations to treat medically for CAD.   He was then evaluated by the multidisciplinary valve team and felt to have severe, symptomatic aortic stenosis and to be a suitable candidate for TAVR, which was set up for 02/04/23.     Hospital Course    Severe AS: s/p successful TAVR with a 26 mm Edwards Sapien 3 THV via the TF approach on 02/05/23. Post operative echo pending. Groin sites are stable. ECG with NSR and no high grade heart block. Continue ASA monotherapy. He has  ambulated with CRI without issues. Post procedure instructions reviewed with understanding. Will require lifelong dental SBE. Plan to RX amoxicillin at [facility] follow up next week.    CAD: Pre TAVR R/LHC with pRCA lesion at 20% stenosed, ostial LM to mLM lesion is 40% stenosed, mLAD lesion at 40% stenosed, dLAD lesion is 30% stenosed with normal right heart pressures RA 3, RV 33/0/8, PA 32/5 mean 16, PCWP 10, AO 112/55 with recommendations to treat medically for CAD. Denies anginal symptoms.   HTN: Currently not taking antihypertensives. May require addition in the OP setting. Would recommend beta blocker with HR in the 90's and concomitant CAD. Patient reluctant to start today.    GERD: To resume home Prilosec post discharge.   Carotid artery disease: Continue ASA and statin therapy.   Hyperlipidemia: Continue statin.  Consultants: None    The patient has been seen  and examined by Dr. Cyndia Bent who feels that he is stable and ready for discharge today, 02/05/23.  _____________  Discharge Vitals Blood pressure 127/64, pulse 95, temperature 98.3 F (36.8 C), temperature source Oral, resp. rate 20, height 5\' 7"  (1.702 m), weight 58.5 kg, SpO2 97 %.  Filed Weights   02/03/23 0900 02/04/23 0827 02/05/23 0500  Weight: 59.9 kg 59 kg 58.5 kg   Labs & Radiologic Studies    CBC Recent Labs    02/04/23 1514 02/05/23 0045  WBC  --  7.3  HGB 12.9* 13.7  HCT 38.0* 39.7  MCV  --  90.6  PLT  --  [id number]*   Basic Metabolic Panel Recent Labs    02/04/23 1514 02/05/23 0045  NA 142 137  K 3.9 3.8  CL 107 106  CO2  --  26  GLUCOSE 104* 102*  BUN 14 14  CREATININE 0.80 1.00  CALCIUM  --  8.4*  MG  --  2.0   Liver Function Tests No results for input(s): "AST", "ALT", "ALKPHOS", "BILITOT", "PROT", "ALBUMIN" in the last 72 hours. No results for input(s): "LIPASE", "AMYLASE" in the last 72 hours. Cardiac Enzymes No results for input(s): "CKTOTAL", "CKMB", "CKMBINDEX", "TROPONINI" in the last 72 hours. BNP Invalid input(s): "POCBNP" D-Dimer No results for input(s): "DDIMER" in the last 72 hours. Hemoglobin A1C No results for input(s): "HGBA1C" in the last 72 hours. Fasting Lipid Panel No results for input(s): "CHOL", "HDL", "LDLCALC", "TRIG", "CHOLHDL", "LDLDIRECT" in the last 72 hours. Thyroid Function Tests No results for input(s): "TSH", "T4TOTAL", "T3FREE", "THYROIDAB" in the last 72 hours.  Invalid input(s): "FREET3" _____________  ECHOCARDIOGRAM LIMITED  Result Date: 02/04/2023    ECHOCARDIOGRAM LIMITED REPORT   Patient Name:   Peter Potts Date of Exam: 02/04/2023 Medical Rec #:  [id number]             Height:       67.0 in Accession #:    [id number]            Weight:       132.1 lb Date of Birth:  1941/10/14              BSA:          1.695 m Patient Age:    27 years              BP:           110/78 mmHg Patient Gender: M                      HR:  74 bpm. Exam Location:  Inpatient Procedure: 2D Echo, Limited Color Doppler and Cardiac Doppler Indications:     Aortic Stenosis  History:         Patient has prior history of Echocardiogram examinations, most                  recent 10/08/2022. CAD, Aortic Valve Disease; Risk                  Factors:GERD.  Sonographer:     Bernadene Person RDCS Referring Phys:  [place] Diagnosing Phys: Jenkins Rouge MD IMPRESSIONS  1. The left ventricle has normal function. There is mild left ventricular hypertrophy. Left ventricular diastolic function could not be evaluated.  2. Right ventricular systolic function is normal. The right ventricular size is normal.  3. The mitral valve is abnormal. Trivial mitral valve regurgitation.  4. Pre TAVR: Tri leaflet severely calcified with restricted leaflet motion Mild AR mean gradient 30 mmHg peak 43 mmHg AVA 0.66 cm2         Post TAVR: well placed 26 mm Sapien 3 valve mean gradient 1 peak 3 mmHg no significant PVL AVA 2.8 cm2. The aortic valve has been repaired/replaced. FINDINGS  Left Ventricle: The left ventricle has normal function. The left ventricular internal cavity size was normal in size. There is mild left ventricular hypertrophy. Left ventricular diastolic function could not be evaluated. Right Ventricle: The right ventricular size is normal. No increase in right ventricular wall thickness. Right ventricular systolic function is normal. Left Atrium: Left atrial size was normal in size. Right Atrium: Right atrial size was normal in size. Pericardium: Trivial pericardial effusion is present. The pericardial effusion is anterior to the right ventricle. Mitral Valve: The mitral valve is abnormal. There is mild thickening of the mitral valve leaflet(s). There is mild calcification of the mitral valve leaflet(s). Mild mitral annular calcification. Trivial mitral valve regurgitation. Tricuspid Valve: The tricuspid valve is normal in structure.  Tricuspid valve regurgitation is mild. Aortic Valve: Pre TAVR: Tri leaflet severely calcified with restricted leaflet motion Mild AR mean gradient 30 mmHg peak 43 mmHg AVA 0.66 cm2 Post TAVR: well placed 26 mm Sapien 3 valve mean gradient 1 peak 3 mmHg no significant PVL AVA 2.8 cm2. The aortic valve has been repaired/replaced. Aortic valve mean gradient measures 1.0 mmHg. Aortic valve peak gradient measures 2.5 mmHg. Aortic valve area, by VTI measures 2.99 cm. Pulmonic Valve: The pulmonic valve was not assessed. Pulmonic valve regurgitation is not visualized. Aorta: The aortic root is normal in size and structure. IAS/Shunts: The interatrial septum was not assessed. Additional Comments: Spectral Doppler performed. Color Doppler performed.  LEFT VENTRICLE PLAX 2D LVOT diam:     2.30 cm LV SV:         59 LV SV Index:   35 LVOT Area:     4.15 cm  AORTIC VALVE AV Area (Vmax):    2.72 cm AV Area (Vmean):   2.81 cm AV Area (VTI):     2.99 cm AV Vmax:           79.65 cm/s AV Vmean:          54.050 cm/s AV VTI:            0.198 m AV Peak Grad:      2.5 mmHg AV Mean Grad:      1.0 mmHg LVOT Vmax:         52.20 cm/s LVOT Vmean:  36.600 cm/s LVOT VTI:          0.143 m LVOT/AV VTI ratio: 0.72  SHUNTS Systemic VTI:  0.14 m Systemic Diam: 2.30 cm Jenkins Rouge MD Electronically signed by Jenkins Rouge MD Signature Date/Time: 02/04/2023/2:48:27 PM    Final    Structural Heart Procedure  Result Date: 02/04/2023 See surgical note for result.  DG Chest 2 View  Result Date: 02/03/2023 CLINICAL DATA:  Preoperative exam for TAVR procedure. EXAM: CHEST - 2 VIEW COMPARISON:  09/05/2021. FINDINGS: The heart size and mediastinal contours are within normal limits. No consolidation, effusion, or pneumothorax. No acute osseous abnormality. IMPRESSION: No active cardiopulmonary disease. Electronically Signed   By: Brett Fairy M.D.   On: 02/03/2023 04:19   Disposition   Pt is being discharged home today in good  condition.  Follow-up Plans & Appointments    Follow-up Information     Burnell Blanks, MD Follow up on 02/13/2023.   Specialty: Cardiology Why: @ 940am. Please arrive to your appointment at 9:20am Contact information: [address] [place] [address] [phone]                Discharge Instructions     Amb Referral to Cardiac Rehabilitation   Complete by: As directed    [place]   Diagnosis: Valve Replacement   Valve: Aortic   After initial evaluation and assessments completed: Virtual Based Care may be provided alone or in conjunction with Phase 2 Cardiac Rehab based on patient barriers.: Yes   Intensive Cardiac Rehabilitation (ICR) [place] location only OR Traditional Cardiac Rehabilitation (TCR) *If criteria for ICR are not met will enroll in TCR [facility] only): Yes   Call MD for:  difficulty breathing, headache or visual disturbances   Complete by: As directed    Call MD for:  extreme fatigue   Complete by: As directed    Call MD for:  hives   Complete by: As directed    Call MD for:  persistant dizziness or light-headedness   Complete by: As directed    Call MD for:  persistant nausea and vomiting   Complete by: As directed    Call MD for:  redness, tenderness, or signs of infection (pain, swelling, redness, odor or green/yellow discharge around incision site)   Complete by: As directed    Call MD for:  severe uncontrolled pain   Complete by: As directed    Call MD for:  temperature >100.4   Complete by: As directed    Diet - low sodium heart healthy   Complete by: As directed    Discharge instructions   Complete by: As directed    ACTIVITY AND EXERCISE  Daily activity and exercise are an important part of your recovery. People recover at different rates depending on their general health and type of valve procedure.  Most people recovering from TAVR feel better relatively quickly   No lifting, pushing, pulling more than 10 pounds  (examples to avoid: groceries, vacuuming, gardening, golfing):             - For one week with a procedure through the groin.             - For six weeks for procedures through the chest wall or neck. NOTE: You will typically see one of our providers 7-14 days after your procedure to discuss [place] the above activities.      DRIVING  Do not drive until you are  seen for follow up and cleared by a provider. Generally, we ask patient to not drive for 1 week after their procedure.  If you have been told by your doctor in the past that you may not drive, you must talk with him/her before you begin driving again.   DRESSING  Groin site: you may leave the clear dressing over the site for up to one week or until it falls off.   HYGIENE  If you had a femoral (leg) procedure, you may take a shower when you return home. After the shower, pat the site dry. Do NOT use powder, oils or lotions in your groin area until the site has completely healed.  If you had a chest procedure, you may shower when you return home unless specifically instructed not to by your discharging practitioner.             - DO NOT scrub incision; pat dry with a towel.             - DO NOT apply any lotions, oils, powders to the incision.             - No tub baths / swimming for at least 2 weeks.  If you notice any fevers, chills, increased pain, swelling, bleeding or pus, please contact your doctor.   ADDITIONAL INFORMATION  If you are going to have an upcoming dental procedure, please contact our office as you will require antibiotics ahead of time to prevent infection on your heart valve.    If you have any questions or concerns you can call the structural heart phone during normal business hours 8am-4pm. If you have an urgent need after hours or weekends please call [phone] to talk to the on call provider for general cardiology. If you have an emergency that requires immediate attention, please call 911.     After TAVR Checklist  Check  Test Description  Follow up appointment in 1-2 weeks  You will see our structural heart advanced practice provider. Your incision sites will be checked and you will be cleared to drive and resume all normal activities if you are doing well.    1 month echo and follow up  You will have an echo to check on your new heart valve and be seen back in the office by a structural heart advanced practice provider.  Follow up with your primary cardiologist You will need to be seen by your primary cardiologist in the following 3-6 months after your 1 month appointment in the valve clinic.   1 year echo and follow up You will have another echo to check on your heart valve after 1 year and be seen back in the office by a structural heart advanced practice provider. This your last structural heart visit.  Bacterial endocarditis prophylaxis  You will have to take antibiotics for the rest of your life before all dental procedures (even teeth cleanings) to protect your heart valve. Antibiotics are also required before some surgeries. Please check with your cardiologist before scheduling any surgeries. Also, please make sure to tell us if you have a penicillin allergy as you will require an alternative antibiotic.   Increase activity slowly   Complete by: As directed       Discharge Medications   Allergies as of 02/05/2023   No Known Allergies      Medication List     TAKE these medications    acetaminophen 500 MG tablet Commonly known as: TYLENOL Take 500-1,000  mg by mouth every 6 (six) hours as needed (pain.).   aspirin EC 81 MG tablet Take 81 mg by mouth at bedtime.   atorvastatin 40 MG tablet Commonly known as: LIPITOR TAKE 1 TABLET BY MOUTH EVERY DAY   cetirizine 10 MG tablet Commonly known as: ZYRTEC Take 1 tablet (10 mg total) by mouth daily. What changed: when to take this   cholecalciferol 25 MCG (1000 UT) tablet Take 1,000 Units by mouth in the  morning and at bedtime.   CoQ10 100 MG Caps Take 100 mg by mouth in the morning.   Fish Oil 1200 MG Caps Take 1,200 mg by mouth in the morning.   hypromellose 0.3 % Gel ophthalmic ointment Commonly known as: GENTEAL Place 1 Application into both eyes at bedtime. Systane Gel   omeprazole 20 MG capsule Commonly known as: PRILOSEC Take 1 capsule (20 mg total) by mouth daily. Please call [phone] to schedule an office visit for more refills What changed:  when to take this reasons to take this   PROBIOTIC PO Take 1 capsule by mouth in the morning.   Systane Ultra 0.4-0.3 % Soln Generic drug: Polyethyl Glycol-Propyl Glycol Place 1-2 drops into both eyes 3 (three) times daily as needed (dry/irritated eyes.).   zinc gluconate 50 MG tablet Take 50 mg by mouth daily.   zolpidem 10 MG tablet Commonly known as: AMBIEN Take 5 mg by mouth as needed (sleep (while on vacations ONLY)).        Outstanding Labs/Studies   None   Duration of Discharge Encounter   Greater than 30 minutes including physician time.  SignedKathyrn Drown, NP 02/05/2023, 11:51 AM [phone]

## 2023-02-04 NOTE — Anesthesia Procedure Notes (Signed)
Procedure Name: MAC Date/Time: 02/04/2023 1:01 PM  Performed by: Leonor Liv, CRNAPre-anesthesia Checklist: Patient identified, Emergency Drugs available, Suction available, Patient being monitored and Timeout performed Patient Re-evaluated:Patient Re-evaluated prior to induction Oxygen Delivery Method: Simple face mask Placement Confirmation: positive ETCO2 Dental Injury: Teeth and Oropharynx as per pre-operative assessment

## 2023-02-04 NOTE — Transfer of Care (Signed)
Immediate Anesthesia Transfer of Care Note  Patient: Saulo Pittinger III  Procedure(s) Performed: Transcatheter Aortic Valve Replacement, Transfemoral with 51mm Sapen 3 Ultra vlave (Chest) INTRAOPERATIVE TRANSTHORACIC ECHOCARDIOGRAM  Patient Location: PACU  Anesthesia Type:MAC  Level of Consciousness: awake, alert , and oriented  Airway & Oxygen Therapy: Patient Spontanous Breathing and Patient connected to nasal cannula oxygen  Post-op Assessment: Report given to RN, Post -op Vital signs reviewed and stable, and Patient moving all extremities  Post vital signs: Reviewed and stable  Last Vitals:  Vitals Value Taken Time  BP    Temp    Pulse 66 02/04/23 1456  Resp 13 02/04/23 1456  SpO2 94 % 02/04/23 1456  Vitals shown include unvalidated device data.  Last Pain:  Vitals:   02/04/23 0853  TempSrc:   PainSc: 0-No pain      Patients Stated Pain Goal: 0 ([id number])  Complications: No notable events documented.

## 2023-02-04 NOTE — Anesthesia Postprocedure Evaluation (Signed)
Anesthesia Post Note  Patient: Redrick Nordahl III  Procedure(s) Performed: Transcatheter Aortic Valve Replacement, Transfemoral with 64mm Sapen 3 Ultra vlave (Chest) INTRAOPERATIVE TRANSTHORACIC ECHOCARDIOGRAM     Patient location during evaluation: PACU Anesthesia Type: MAC Level of consciousness: awake and alert Pain management: pain level controlled Vital Signs Assessment: post-procedure vital signs reviewed and stable Respiratory status: spontaneous breathing, nonlabored ventilation and respiratory function stable Cardiovascular status: blood pressure returned to baseline and stable Postop Assessment: no apparent nausea or vomiting Anesthetic complications: no   No notable events documented.  Last Vitals:  Vitals:   02/04/23 1535 02/04/23 1540  BP: 116/84 116/84  Pulse: 71 67  Resp: 10 14  Temp:  36.7 C  SpO2: 98%     Last Pain:  Vitals:   02/04/23 1540  TempSrc: Temporal  PainSc: 0-No pain                 Lynda Rainwater

## 2023-02-04 NOTE — Discharge Summary (Incomplete)
[facility] VALVE TEAM  Discharge Summary    Patient ID: Peter Potts MRN: [id number]; DOB: 1941/11/02  Admit date: 02/04/2023 Discharge date: 02/04/2023  Primary Care Provider: Rikki Spearing, NP  Primary Cardiologist: Candee Furbish, MD ***  Discharge Diagnoses    Active Problems:   * No active hospital problems. *   Allergies No Known Allergies  Diagnostic Studies/Procedures    *** _____________    Echo ***: completed but pending formal read at the time of discharge   History of Present Illness     Peter Potts is a 82 y.o. male with a history of *** who presented to [facility] on *** for planned TAVR.   Hospital Course     Consultants: ***   *** _____________  Discharge Vitals Blood pressure (!) 158/69, pulse 74, temperature 97.7 F (36.5 C), temperature source Oral, resp. rate 17, height 5\' 7"  (1.702 m), weight 59 kg, SpO2 100 %.  Filed Weights   02/03/23 0900 02/04/23 0827  Weight: 59.9 kg 59 kg    *** physical exam  Labs & Radiologic Studies    CBC No results for input(s): "WBC", "NEUTROABS", "HGB", "HCT", "MCV", "PLT" in the last 72 hours. Basic Metabolic Panel No results for input(s): "NA", "K", "CL", "CO2", "GLUCOSE", "BUN", "CREATININE", "CALCIUM", "MG", "PHOS" in the last 72 hours. Liver Function Tests No results for input(s): "AST", "ALT", "ALKPHOS", "BILITOT", "PROT", "ALBUMIN" in the last 72 hours. No results for input(s): "LIPASE", "AMYLASE" in the last 72 hours. Cardiac Enzymes No results for input(s): "CKTOTAL", "CKMB", "CKMBINDEX", "TROPONINI" in the last 72 hours. BNP Invalid input(s): "POCBNP" D-Dimer No results for input(s): "DDIMER" in the last 72 hours. Hemoglobin A1C No results for input(s): "HGBA1C" in the last 72 hours. Fasting Lipid Panel No results for input(s): "CHOL", "HDL", "LDLCALC", "TRIG", "CHOLHDL", "LDLDIRECT" in the last 72 hours. Thyroid Function Tests No results  for input(s): "TSH", "T4TOTAL", "T3FREE", "THYROIDAB" in the last 72 hours.  Invalid input(s): "FREET3" _____________  DG Chest 2 View  Result Date: 02/03/2023 CLINICAL DATA:  Preoperative exam for TAVR procedure. EXAM: CHEST - 2 VIEW COMPARISON:  09/05/2021. FINDINGS: The heart size and mediastinal contours are within normal limits. No consolidation, effusion, or pneumothorax. No acute osseous abnormality. IMPRESSION: No active cardiopulmonary disease. Electronically Signed   By: Brett Fairy M.D.   On: 02/03/2023 04:19   CT CORONARY MORPH W/CTA COR W/SCORE W/CA W/CM &/OR WO/CM  Addendum Date: 01/06/2023   ADDENDUM REPORT: 01/06/2023 14:22 EXAM: OVER-READ INTERPRETATION  CT CHEST The following report is an over-read performed by radiologist Dr. Samara Snide [facility] [place] on 01/06/2023. This over-read does not include interpretation of cardiac or coronary anatomy or pathology. The cardiac CTA interpretation by the cardiologist is attached. COMPARISON:  10/07/2017 coronary CT. FINDINGS: Please see the separate concurrent chest CT angiogram report for details. IMPRESSION: Please see the separate concurrent chest CT angiogram report for details. Electronically Signed   By: Ilona Sorrel M.D.   On: 01/06/2023 14:22   Result Date: 01/06/2023 CLINICAL DATA:  28M with severe aortic stenosis being evaluated for a TAVR procedure. EXAM: Cardiac TAVR CT TECHNIQUE: The patient was scanned on a [employer]. A 120 kV retrospective scan was triggered in the descending thoracic aorta at 111 HU's. Gantry rotation speed was 250 msecs and collimation was .6 mm. No beta blockade or nitro were given. The 3D data set was reconstructed in 5% intervals of the  R-R cycle. Systolic and diastolic phases were analyzed on a dedicated work station using MPR, MIP and VRT modes. The patient received 100 cc of contrast. FINDINGS: Aortic Root: Aortic valve: Trileaflet Aortic valve calcium score: 1479 Aortic annulus:  Diameter: 69mm x 81mm Perimeter: 81mm Area: 465 mm^2 Calcifications: No calcifications Coronary height: Min Left - 75mm, Min Right - 50mm Sinotubular height: Left cusp - 39mm; Right cusp - 4mm; Noncoronary cusp - 20mm LVOT (as measured 3 mm below the annulus): Diameter: 18mm x 42mm Area: 441 mm^2 Calcifications: No calcifications Aortic sinus width: Left cusp - 21mm; Right cusp - 28mm; Noncoronary cusp - 95mm Sinotubular junction width: 11mm x 74mm Optimum Fluoroscopic Angle for Delivery: LAU 13 CAU 13 Cardiac: Right atrium: Mild enlargement Right ventricle: Mild enlargement Pulmonary arteries: Normal size Pulmonary veins: Normal configuration Left atrium: Mild enlargement Left ventricle: Normal size Pericardium: Normal thickness Coronary arteries: Normal origins.  Coronary calcium score 748 IMPRESSION: 1. Trileaflet aortic valve with moderate calcifications (AV calcium score 1479) 2. Aortic annulus measures 1mm x 51mm in diameter with perimeter 25mm and area 465 mm^2. No annular or LVOT calcifications. Annular measurements are suitable for delivery of a 57mm Edwards Sapien 3 valve 3. Sufficient coronary to annulus distance. 4. Optimum Fluoroscopic Angle for Delivery:  LAU 13 CAU 13 5. Coronary calcium score 748 (64th percentile) Electronically Signed: By: Oswaldo Milian M.D. On: 01/06/2023 13:56   CT ANGIO ABDOMEN PELVIS  W &/OR WO CONTRAST  Result Date: 01/06/2023 CLINICAL DATA:  Aortic valve replacement (TAVR), pre-op eval. Nonrheumatic aortic valve stenosis. EXAM: CT ANGIOGRAPHY CHEST, ABDOMEN AND PELVIS TECHNIQUE: Multidetector CT imaging through the chest, abdomen and pelvis was performed using the standard protocol during bolus administration of intravenous contrast. Multiplanar reconstructed images and MIPs were obtained and reviewed to evaluate the vascular anatomy. RADIATION DOSE REDUCTION: This exam was performed according to the departmental dose-optimization program which includes automated  exposure control, adjustment of the mA and/or kV according to patient size and/or use of iterative reconstruction technique. CONTRAST:  143mL OMNIPAQUE IOHEXOL 350 MG/ML SOLN COMPARISON:  11/02/2022 unenhanced CT abdomen/pelvis. 10/07/2017 coronary CT. FINDINGS: CTA CHEST FINDINGS Cardiovascular: Normal heart size. No significant pericardial effusion/thickening. Diffuse thickening and coarse calcification of the aortic valve. Three-vessel coronary atherosclerosis. Atherosclerotic nonaneurysmal thoracic aorta. Normal caliber pulmonary arteries. No central pulmonary emboli. Mediastinum/Nodes: No significant thyroid nodules. Unremarkable esophagus. No pathologically enlarged axillary, mediastinal or hilar lymph nodes. Lungs/Pleura: No pneumothorax. No pleural effusion. No acute consolidative airspace disease or lung masses. Minimal patchy tree-in-bud type opacities in the peripheral basilar lower lobes compatible with nonspecific minimal bronchiolitis. No bronchiectasis. No significant pulmonary nodules. Musculoskeletal: No aggressive appearing focal osseous lesions. Mild thoracic spondylosis. CTA ABDOMEN AND PELVIS FINDINGS Hepatobiliary: Normal liver with no liver mass. Normal gallbladder with no radiopaque cholelithiasis. No biliary ductal dilatation. Pancreas: Normal, with no mass or duct dilation. Spleen: Normal size. No mass. Adrenals/Urinary Tract: Normal adrenals. No hydronephrosis. Scattered simple renal cysts, largest 7.8 cm in the anterior lower left kidney with additional scattered subcentimeter hypodense bilateral renal cortical lesions that are too small to characterize, for which no follow-up imaging is recommended. Normal bladder. Stomach/Bowel: Normal non-distended stomach. Normal caliber small bowel with no small bowel wall thickening. Normal appendix. Postsurgical changes from partial distal colectomy with intact appearing distal colonic anastomosis. Scattered mild colonic diverticulosis with no  large bowel wall thickening or significant pericolonic fat stranding. Vascular/Lymphatic: Atherosclerotic nonaneurysmal abdominal aorta. No pathologically enlarged lymph nodes in the abdomen or pelvis. Reproductive:  Top-normal size prostate. Other: No pneumoperitoneum, ascites or focal fluid collection. Musculoskeletal: No aggressive appearing focal osseous lesions. Mild lumbar spondylosis. VASCULAR MEASUREMENTS PERTINENT TO TAVR: AORTA: Minimal Aortic Diameter-14.6 x 14.5 mm Severity of Aortic Calcification-mild RIGHT PELVIS: Right Common Iliac Artery - Minimal Diameter-9.3 x 8.6 mm Tortuosity-mild Calcification-mild Right External Iliac Artery - Minimal Diameter-7.6 x 7.6 mm Tortuosity-mild-to-moderate Calcification-none Right Common Femoral Artery - Minimal Diameter-9.0 x 7.7 mm Tortuosity-mild Calcification-mild LEFT PELVIS: Left Common Iliac Artery - Minimal Diameter-9.1 x 9.0 mm Tortuosity-mild Calcification-mild Left External Iliac Artery - Minimal Diameter-7.6 x 7.1 mm Tortuosity-mild Calcification-none Left Common Femoral Artery - Minimal Diameter-8.5 x 8.4 mm Tortuosity-mild Calcification-none Review of the MIP images confirms the above findings. IMPRESSION: 1. Vascular findings and measurements pertinent to potential TAVR procedure, as detailed. 2. Diffuse thickening and coarse calcification of the aortic valve, compatible with the reported history of aortic stenosis. 3. Three-vessel coronary atherosclerosis. 4. Mild colonic diverticulosis. 5. Minimal patchy tree-in-bud type opacities in the peripheral basilar lower lobes compatible with nonspecific minimal bronchiolitis. 6.  Aortic Atherosclerosis (ICD10-I70.0). Electronically Signed   By: Ilona Sorrel M.D.   On: 01/06/2023 14:19   CT ANGIO CHEST AORTA W/CM & OR WO/CM  Result Date: 01/06/2023 CLINICAL DATA:  Aortic valve replacement (TAVR), pre-op eval. Nonrheumatic aortic valve stenosis. EXAM: CT ANGIOGRAPHY CHEST, ABDOMEN AND PELVIS TECHNIQUE:  Multidetector CT imaging through the chest, abdomen and pelvis was performed using the standard protocol during bolus administration of intravenous contrast. Multiplanar reconstructed images and MIPs were obtained and reviewed to evaluate the vascular anatomy. RADIATION DOSE REDUCTION: This exam was performed according to the departmental dose-optimization program which includes automated exposure control, adjustment of the mA and/or kV according to patient size and/or use of iterative reconstruction technique. CONTRAST:  184mL OMNIPAQUE IOHEXOL 350 MG/ML SOLN COMPARISON:  11/02/2022 unenhanced CT abdomen/pelvis. 10/07/2017 coronary CT. FINDINGS: CTA CHEST FINDINGS Cardiovascular: Normal heart size. No significant pericardial effusion/thickening. Diffuse thickening and coarse calcification of the aortic valve. Three-vessel coronary atherosclerosis. Atherosclerotic nonaneurysmal thoracic aorta. Normal caliber pulmonary arteries. No central pulmonary emboli. Mediastinum/Nodes: No significant thyroid nodules. Unremarkable esophagus. No pathologically enlarged axillary, mediastinal or hilar lymph nodes. Lungs/Pleura: No pneumothorax. No pleural effusion. No acute consolidative airspace disease or lung masses. Minimal patchy tree-in-bud type opacities in the peripheral basilar lower lobes compatible with nonspecific minimal bronchiolitis. No bronchiectasis. No significant pulmonary nodules. Musculoskeletal: No aggressive appearing focal osseous lesions. Mild thoracic spondylosis. CTA ABDOMEN AND PELVIS FINDINGS Hepatobiliary: Normal liver with no liver mass. Normal gallbladder with no radiopaque cholelithiasis. No biliary ductal dilatation. Pancreas: Normal, with no mass or duct dilation. Spleen: Normal size. No mass. Adrenals/Urinary Tract: Normal adrenals. No hydronephrosis. Scattered simple renal cysts, largest 7.8 cm in the anterior lower left kidney with additional scattered subcentimeter hypodense bilateral renal  cortical lesions that are too small to characterize, for which no follow-up imaging is recommended. Normal bladder. Stomach/Bowel: Normal non-distended stomach. Normal caliber small bowel with no small bowel wall thickening. Normal appendix. Postsurgical changes from partial distal colectomy with intact appearing distal colonic anastomosis. Scattered mild colonic diverticulosis with no large bowel wall thickening or significant pericolonic fat stranding. Vascular/Lymphatic: Atherosclerotic nonaneurysmal abdominal aorta. No pathologically enlarged lymph nodes in the abdomen or pelvis. Reproductive: Top-normal size prostate. Other: No pneumoperitoneum, ascites or focal fluid collection. Musculoskeletal: No aggressive appearing focal osseous lesions. Mild lumbar spondylosis. VASCULAR MEASUREMENTS PERTINENT TO TAVR: AORTA: Minimal Aortic Diameter-14.6 x 14.5 mm Severity of Aortic Calcification-mild RIGHT PELVIS: Right Common Iliac Artery - Minimal  Diameter-9.3 x 8.6 mm Tortuosity-mild Calcification-mild Right External Iliac Artery - Minimal Diameter-7.6 x 7.6 mm Tortuosity-mild-to-moderate Calcification-none Right Common Femoral Artery - Minimal Diameter-9.0 x 7.7 mm Tortuosity-mild Calcification-mild LEFT PELVIS: Left Common Iliac Artery - Minimal Diameter-9.1 x 9.0 mm Tortuosity-mild Calcification-mild Left External Iliac Artery - Minimal Diameter-7.6 x 7.1 mm Tortuosity-mild Calcification-none Left Common Femoral Artery - Minimal Diameter-8.5 x 8.4 mm Tortuosity-mild Calcification-none Review of the MIP images confirms the above findings. IMPRESSION: 1. Vascular findings and measurements pertinent to potential TAVR procedure, as detailed. 2. Diffuse thickening and coarse calcification of the aortic valve, compatible with the reported history of aortic stenosis. 3. Three-vessel coronary atherosclerosis. 4. Mild colonic diverticulosis. 5. Minimal patchy tree-in-bud type opacities in the peripheral basilar lower lobes  compatible with nonspecific minimal bronchiolitis. 6.  Aortic Atherosclerosis (ICD10-I70.0). Electronically Signed   By: Ilona Sorrel M.D.   On: 01/06/2023 14:19   Disposition   Pt is being discharged home today in good condition.  Follow-up Plans & Appointments       Discharge Medications   Allergies as of 02/04/2023   No Known Allergies   Med Rec must be completed prior to using this [facility]***           Outstanding Labs/Studies   ***  Duration of Discharge Encounter   Greater than 30 minutes including physician time.  SignedKathyrn Drown, NP 02/04/2023, 9:05 AM [phone]

## 2023-02-04 NOTE — Progress Notes (Signed)
Echocardiogram 2D Echocardiogram has been performed.  Fidel Levy 02/04/2023, 3:00 PM

## 2023-02-04 NOTE — Anesthesia Preprocedure Evaluation (Addendum)
Anesthesia Evaluation  Patient identified by MRN, date of birth, ID band Patient awake    Reviewed: Allergy & Precautions, H&P , NPO status , Patient's Chart, lab work & pertinent test results  Airway Mallampati: II  TM Distance: >3 FB Neck ROM: Full    Dental no notable dental hx. (+) Teeth Intact, Dental Advisory Given   Pulmonary neg pulmonary ROS   Pulmonary exam normal breath sounds clear to auscultation       Cardiovascular + CAD  + Valvular Problems/Murmurs AS  Rhythm:Regular Rate:Normal + Systolic murmurs    Neuro/Psych negative neurological ROS  negative psych ROS   GI/Hepatic Neg liver ROS,GERD  Medicated,,  Endo/Other  negative endocrine ROS    Renal/GU negative Renal ROS  negative genitourinary   Musculoskeletal   Abdominal   Peds  Hematology negative hematology ROS (+)   Anesthesia Other Findings   Reproductive/Obstetrics negative OB ROS                             Anesthesia Physical Anesthesia Plan  ASA: 4  Anesthesia Plan: MAC   Post-op Pain Management: Tylenol PO (pre-op)*   Induction: Intravenous  PONV Risk Score and Plan: 2 and Propofol infusion and Ondansetron  Airway Management Planned: Natural Airway and Simple Face Mask  Additional Equipment: Arterial line  Intra-op Plan:   Post-operative Plan:   Informed Consent: I have reviewed the patients History and Physical, chart, labs and discussed the procedure including the risks, benefits and alternatives for the proposed anesthesia with the patient or authorized representative who has indicated his/her understanding and acceptance.     Dental advisory given  Plan Discussed with: CRNA  Anesthesia Plan Comments:        Anesthesia Quick Evaluation

## 2023-02-04 NOTE — Op Note (Signed)
[facility]   MULTIDISCIPLINARY HEART VALVE TEAM   TAVR OPERATIVE NOTE   Date of Procedure:  02/04/2023  Preoperative Diagnosis: Severe Aortic Stenosis   Postoperative Diagnosis: Same   Procedure:   Transcatheter Aortic Valve Replacement - Percutaneous Right Transfemoral Approach  Edwards Sapien 3 Ultra Resilia THV (size 26 mm, model # 9755RSL, serial # [id number])   Co-Surgeons:  Gaye Pollack, MD and Lauree Chandler   Anesthesiologist:  Suann Larry, MD  Echocardiographer:  Jenkins Rouge, MD  Pre-operative Echo Findings: Severe aortic stenosis Normal left ventricular systolic function  Post-operative Echo Findings: No paravalvular leak Normal left ventricular systolic function   BRIEF CLINICAL NOTE AND INDICATIONS FOR SURGERY  Peter Potts 82 y.o. male referred in consultation for transcatheter aortic valve implantation   + Severe, symptomatic aortic stenosis.   Meets criteria for AV replacement.   We talked about LM lesion 40% and with cardiology at [facility] conference and it was thought to be non-obstructive.      Risks/benefits/alternatives of TAVR were discussed at length (90% standard recovery, 6-9% morbidity [any organ, typically access site vascular complication needing surgery, stroke, or pacemaker] and <1% mortality. Options are TAVR, SAVR or medical treatment. Patient understands SAVR is typically higher risk and takes longer recovery than TAVR in elderly patients. As well, discussed that medical Tx yields around 50% mortality for symptomatic aortic stenosis in 2 years if left untreated, and this is an option.     DETAILS OF THE OPERATIVE PROCEDURE  PREPARATION:    The patient was brought to the operating room on the above mentioned date and appropriate monitoring was established by the anesthesia team. The patient was placed in the supine position on the operating table.  Intravenous antibiotics were administered. The  patient was monitored closely throughout the procedure under conscious sedation.  General endotracheal anesthesia was induced uneventfully.  Baseline transthoracic echocardiogram was performed. The patient's abdomen and both groins were prepped and draped in a sterile manner. A time out procedure was performed.   PERIPHERAL ACCESS:    Using the modified Seldinger technique, femoral arterial and venous access was obtained with placement of 6 Fr sheaths on the left side.  A pigtail diagnostic catheter was passed through the left arterial sheath under fluoroscopic guidance into the aortic root.  A temporary transvenous pacemaker catheter was passed through the left femoral venous sheath under fluoroscopic guidance into the right ventricle.  The pacemaker was tested to ensure stable lead placement and pacemaker capture. Aortic root angiography was performed in order to determine the optimal angiographic angle for valve deployment.   TRANSFEMORAL ACCESS:   Percutaneous transfemoral access and sheath placement was performed using ultrasound guidance.  The right common femoral artery was cannulated using a micropuncture needle and appropriate location was verified using hand injection angiogram.  A pair of Abbott Perclose percutaneous closure devices were placed and a 6 French sheath replaced into the femoral artery.  The patient was heparinized systemically and ACT verified > 250 seconds.    A 14 Fr transfemoral E-sheath was introduced into the right common femoral artery after progressively dilating over an Amplatz superstiff wire. An AL-2 catheter was used to direct a straight-tip exchange length wire across the native aortic valve into the left ventricle. This was exchanged out for a pigtail catheter and position was confirmed in the LV apex. Simultaneous LV and Ao pressures were recorded.  The pigtail catheter was exchanged for a Safari wire in the LV apex.  BALLOON AORTIC VALVULOPLASTY:   Not  performed   TRANSCATHETER HEART VALVE DEPLOYMENT:   An Edwards Sapien 3 Ultra transcatheter heart valve (size 26 mm) was prepared and crimped per manufacturer's guidelines, and the proper orientation of the valve is confirmed on the [employer] delivery system. The valve was advanced through the introducer sheath using normal technique until in an appropriate position in the abdominal aorta beyond the sheath tip. The balloon was then retracted and using the fine-tuning wheel was centered on the valve. The valve was then advanced across the aortic arch using appropriate flexion of the catheter. The valve was carefully positioned across the aortic valve annulus. The Commander catheter was retracted using normal technique. Once final position of the valve has been confirmed by angiographic assessment, the valve is deployed during rapid ventricular pacing to maintain systolic blood pressure < 50 mmHg and pulse pressure < 10 mmHg. The balloon inflation is held for >3 seconds after reaching full deployment volume. Once the balloon has fully deflated the balloon is retracted into the ascending aorta and valve function is assessed using echocardiography. There is felt to be no paravalvular leak and no central aortic insufficiency.  The patient's hemodynamic recovery following valve deployment is good.  The deployment balloon and guidewire are both removed.    PROCEDURE COMPLETION:   The sheath was removed and femoral artery closure performed.  Protamine was administered once femoral arterial repair was complete. The temporary pacemaker, pigtail catheter and femoral sheaths were removed with manual pressure used for venous hemostasis.  A Mynx femoral closure device was utilized following removal of the diagnostic sheath in the left femoral artery.  The patient tolerated the procedure well and is transported to the cath lab recovery area in stable condition. There were no immediate intraoperative  complications. All sponge instrument and needle counts are verified correct at completion of the operation.   No blood products were administered during the operation.  The patient received a total of 41.6 mL of intravenous contrast during the procedure.   Gaye Pollack, MD 02/04/2023

## 2023-02-04 NOTE — Interval H&P Note (Signed)
History and Physical Interval Note:  02/04/2023 8:44 AM  Peter Potts  has presented today for surgery, with the diagnosis of Severe Aortic Stenosis.  The various methods of treatment have been discussed with the patient and family. After consideration of risks, benefits and other options for treatment, the patient has consented to  Procedure(s): Transcatheter Aortic Valve Replacement, Transfemoral (N/A) INTRAOPERATIVE TRANSTHORACIC ECHOCARDIOGRAM (N/A) as a surgical intervention.  The patient's history has been reviewed, patient examined, no change in status, stable for surgery.  I have reviewed the patient's chart and labs.  Questions were answered to the patient's satisfaction.     Gaye Pollack

## 2023-02-04 NOTE — Progress Notes (Signed)
  [facility]   MULTIDISCIPLINARY HEART VALVE TEAM  Patient doing well s/p TAVR. He is hemodynamically stable. Groin sites stable. ECG with no high grade block. Plan to DC arterial line and transfer to 4E.  Plan for early ambulation after bedrest completed and hopeful discharge over the next 24-48 hours.   Braeton Wolgamott NP-C Structural Heart Team  Pager: [phone] Phone: [phone]  

## 2023-02-05 ENCOUNTER — Encounter (HOSPITAL_COMMUNITY): Payer: Self-pay | Admitting: Cardiovascular Disease

## 2023-02-05 ENCOUNTER — Inpatient Hospital Stay (HOSPITAL_COMMUNITY): Payer: Medicare HMO

## 2023-02-05 DIAGNOSIS — Z006 Encounter for examination for normal comparison and control in clinical research program: Secondary | ICD-10-CM | POA: Diagnosis not present

## 2023-02-05 DIAGNOSIS — I2510 Atherosclerotic heart disease of native coronary artery without angina pectoris: Secondary | ICD-10-CM | POA: Insufficient documentation

## 2023-02-05 DIAGNOSIS — Z952 Presence of prosthetic heart valve: Secondary | ICD-10-CM | POA: Diagnosis not present

## 2023-02-05 DIAGNOSIS — E785 Hyperlipidemia, unspecified: Secondary | ICD-10-CM | POA: Diagnosis not present

## 2023-02-05 DIAGNOSIS — I10 Essential (primary) hypertension: Secondary | ICD-10-CM | POA: Diagnosis not present

## 2023-02-05 DIAGNOSIS — I350 Nonrheumatic aortic (valve) stenosis: Secondary | ICD-10-CM | POA: Diagnosis not present

## 2023-02-05 DIAGNOSIS — Z954 Presence of other heart-valve replacement: Secondary | ICD-10-CM

## 2023-02-05 LAB — ECHOCARDIOGRAM COMPLETE
AR max vel: 1.64 cm2
AV Area VTI: 1.57 cm2
AV Area mean vel: 1.6 cm2
AV Mean grad: 3 mmHg
AV Peak grad: 6.5 mmHg
Ao pk vel: 1.27 m/s
Area-P 1/2: 2.87 cm2
Height: 67 in
S' Lateral: 2.3 cm
Weight: 2063.51 oz

## 2023-02-05 LAB — CBC
HCT: 39.7 % (ref 39.0–52.0)
Hemoglobin: 13.7 g/dL (ref 13.0–17.0)
MCH: 31.3 pg (ref 26.0–34.0)
MCHC: 34.5 g/dL (ref 30.0–36.0)
MCV: 90.6 fL (ref 80.0–100.0)
Platelets: 124 10*3/uL — ABNORMAL LOW (ref 150–400)
RBC: 4.38 MIL/uL (ref 4.22–5.81)
RDW: 12.8 % (ref 11.5–15.5)
WBC: 7.3 10*3/uL (ref 4.0–10.5)
nRBC: 0 % (ref 0.0–0.2)

## 2023-02-05 LAB — BASIC METABOLIC PANEL
Anion gap: 5 (ref 5–15)
BUN: 14 mg/dL (ref 8–23)
CO2: 26 mmol/L (ref 22–32)
Calcium: 8.4 mg/dL — ABNORMAL LOW (ref 8.9–10.3)
Chloride: 106 mmol/L (ref 98–111)
Creatinine, Ser: 1 mg/dL (ref 0.61–1.24)
GFR, Estimated: >60 mL/min (ref >60)
Glucose, Bld: 102 mg/dL — ABNORMAL HIGH (ref 70–99)
Potassium: 3.8 mmol/L (ref 3.5–5.1)
Sodium: 137 mmol/L (ref 135–145)

## 2023-02-05 LAB — MAGNESIUM: Magnesium: 2 mg/dL (ref 1.7–2.4)

## 2023-02-05 NOTE — Progress Notes (Signed)
1 Day Post-Op Procedure(s) (LRB): Transcatheter Aortic Valve Replacement, Transfemoral with 73mm Sapen 3 Ultra vlave (N/A) INTRAOPERATIVE TRANSTHORACIC ECHOCARDIOGRAM (N/A) Subjective: No complaints. Has not ambulated yet.  Monitor reviewed and he has been in sinus rhythm with no bradycardia or heart block.  Objective: Vital signs in last 24 hours: Temp:  [97.5 F (36.4 C)-98.7 F (37.1 C)] 98.4 F (36.9 C) (03/20 0330) Pulse Rate:  [64-87] 85 (03/20 0330) Cardiac Rhythm: Normal sinus rhythm (03/19 2002) Resp:  [10-19] 14 (03/20 0330) BP: (113-158)/(59-84) 113/62 (03/20 0330) SpO2:  [97 %-100 %] 97 % (03/20 0330) Weight:  [58.5 kg-59 kg] 58.5 kg (03/20 0500)  Hemodynamic parameters for last 24 hours:    Intake/Output from previous day: 03/19 0701 - 03/20 0700 In: 2406.3 [P.O.:240; I.V.:1966.3; IV Piggyback:200] Out: 1275 [Urine:1225; Blood:50] Intake/Output this shift: No intake/output data recorded.  General appearance: alert and cooperative Neurologic: intact Heart: regular rate and rhythm, S1, S2 normal, no murmur Lungs: clear to auscultation bilaterally Extremities: warm, no edema Wound: groin sites ok with mild ecchymosis, no hematoma  Lab Results: Recent Labs    02/04/23 1514 02/05/23 0045  WBC  --  7.3  HGB 12.9* 13.7  HCT 38.0* 39.7  PLT  --  124*   BMET:  Recent Labs    02/04/23 1514 02/05/23 0045  NA 142 137  K 3.9 3.8  CL 107 106  CO2  --  26  GLUCOSE 104* 102*  BUN 14 14  CREATININE 0.80 1.00  CALCIUM  --  8.4*    PT/INR: No results for input(s): "LABPROT", "INR" in the last 72 hours. ABG    Component Value Date/Time   PHART 7.374 12/26/2022 0857   HCO3 23.9 12/26/2022 0900   TCO2 23 02/04/2023 1514   ACIDBASEDEF 2.0 12/26/2022 0900   O2SAT 76 12/26/2022 0900   CBG (last 3)  No results for input(s): "GLUCAP" in the last 72 hours.  Assessment/Plan: S/P Procedure(s) (LRB): Transcatheter Aortic Valve Replacement, Transfemoral with  6mm Sapen 3 Ultra vlave (N/A) INTRAOPERATIVE TRANSTHORACIC ECHOCARDIOGRAM (N/A)  POD 1 Hemodynamically stable in sinus rhythm.   Ambulate  2D echo this morning.   Plan home today.   LOS: 1 day    Gaye Pollack 02/05/2023

## 2023-02-05 NOTE — Progress Notes (Signed)
CARDIAC REHAB PHASE I   PRE:  Rate/Rhythm: 91 NSR  BP:  Sitting: 127/64      SaO2: 96 RA  MODE:  Ambulation: 400 ft   AD:  None  POST:  Rate/Rhythm: 102 ST  BP:  Sitting: 134/67      SaO2: 97 RA  Pt amb with standby assistance, pt denies CP and SOB during amb and was returned to room w/o complaint.   Pt walked well w/o SOB and pain.   Discussed diet, exercise, restrictions , and CRPII with pt. Pt denies questions, wants to be referred to [facility] program at [facility].   Peter Potts  9:24 AM 02/05/2023    Service time is from 0858 to [phone].

## 2023-02-05 NOTE — Plan of Care (Signed)
  Problem: Education: Goal: Knowledge of General Education information will improve Description: Including pain rating scale, medication(s)/side effects and non-pharmacologic comfort measures Outcome: Adequate for Discharge   Problem: Health Behavior/Discharge Planning: Goal: Ability to manage health-related needs will improve Outcome: Adequate for Discharge   Problem: Clinical Measurements: Goal: Ability to maintain clinical measurements within normal limits will improve Outcome: Adequate for Discharge Goal: Will remain free from infection Outcome: Adequate for Discharge Goal: Diagnostic test results will improve Outcome: Adequate for Discharge Goal: Respiratory complications will improve Outcome: Adequate for Discharge Goal: Cardiovascular complication will be avoided Outcome: Adequate for Discharge   Problem: Activity: Goal: Risk for activity intolerance will decrease Outcome: Adequate for Discharge   Problem: Nutrition: Goal: Adequate nutrition will be maintained Outcome: Adequate for Discharge   Problem: Coping: Goal: Level of anxiety will decrease Outcome: Adequate for Discharge   Problem: Elimination: Goal: Will not experience complications related to bowel motility Outcome: Adequate for Discharge Goal: Will not experience complications related to urinary retention Outcome: Adequate for Discharge   Problem: Pain Managment: Goal: General experience of comfort will improve Outcome: Adequate for Discharge   Problem: Safety: Goal: Ability to remain free from injury will improve Outcome: Adequate for Discharge   Problem: Skin Integrity: Goal: Risk for impaired skin integrity will decrease Outcome: Adequate for Discharge   Problem: Education: Goal: Understanding of CV disease, CV risk reduction, and recovery process will improve Outcome: Adequate for Discharge   Problem: Cardiovascular: Goal: Ability to achieve and maintain adequate cardiovascular perfusion  will improve Outcome: Adequate for Discharge Goal: Vascular access site(s) Level 0-1 will be maintained Outcome: Adequate for Discharge   Problem: Health Behavior/Discharge Planning: Goal: Ability to safely manage health-related needs after discharge will improve Outcome: Adequate for Discharge

## 2023-02-07 ENCOUNTER — Telehealth: Payer: Self-pay | Admitting: Cardiology

## 2023-02-07 NOTE — Telephone Encounter (Signed)
  [facility]   MULTIDISCIPLINARY HEART VALVE TEAM   Late entry: Attempted to contact the patient regarding discharge from [facility] on 02/05/23 with no answer.   Kathyrn Drown NP-C Structural Heart Team  Pager: [phone]

## 2023-02-07 NOTE — Telephone Encounter (Signed)
Patient contacted regarding discharge from [facility] on 02/05/2023.  Patient understands to follow up with provider Dr Angelena Form on 02/13/2023 at 9:40 AM at [facility] office. Patient understands discharge instructions? yes Patient understands medications and regiment? yes Patient understands to bring all medications to this visit? Yes  The pt is doing well s/p TAVR and his dizziness has resolved.

## 2023-02-13 ENCOUNTER — Encounter: Payer: Self-pay | Admitting: Cardiovascular Disease

## 2023-02-13 ENCOUNTER — Ambulatory Visit: Payer: Medicare HMO | Attending: Cardiovascular Disease | Admitting: Cardiovascular Disease

## 2023-02-13 ENCOUNTER — Telehealth (HOSPITAL_COMMUNITY): Payer: Self-pay

## 2023-02-13 VITALS — BP 110/70 | HR 74 | Ht 67.0 in | Wt 131.6 lb

## 2023-02-13 DIAGNOSIS — I350 Nonrheumatic aortic (valve) stenosis: Secondary | ICD-10-CM | POA: Diagnosis not present

## 2023-02-13 DIAGNOSIS — Z952 Presence of prosthetic heart valve: Secondary | ICD-10-CM | POA: Diagnosis not present

## 2023-02-13 MED FILL — Potassium Chloride Inj 2 mEq/ML: INTRAVENOUS | Qty: 40 | Status: AC

## 2023-02-13 MED FILL — Heparin Sodium (Porcine) Inj 1000 Unit/ML: Qty: 1000 | Status: AC

## 2023-02-13 MED FILL — Magnesium Sulfate Inj 50%: INTRAMUSCULAR | Qty: 10 | Status: AC

## 2023-02-13 NOTE — Progress Notes (Signed)
Structural Heart Clinic Note  Chief Complaint  Patient presents with   Follow-up    Severe aortic stenosis/TAVR   History of Present Illness: 82 yo male with history of CAD, GERD, carotid artery disease, hyperlipidemia and severe aortic stenosis now s/p TAVR who is here today for one week post TAVR follow up. He is followed in our office by Dr. Marlou Porch. I met him in January 2024 to discuss his aortic stenosis. Echo 10/08/22 with LVEF=60-65%. Mild mitral regurgitation. Severe paradoxical low flow/low gradient aortic valve stenosis. When I saw him the first time he described progressive fatigue and dizziness. Cardiac cath  12/26/22 with mild non-obstructive CAD in the left main, LAD and RCA. He underwent TAVR on 02/04/23 with placement of a 26 mm Edwards Sapien 3 Ultra Resilia THV from the transfemoral approach. His post-operative course was uncomplicated.   He is here today for follow up. The patient denies any chest pain, dyspnea, palpitations, lower extremity edema, orthopnea, PND, dizziness, near syncope or syncope. He feels great.   Primary Care Physician: Rikki Spearing, NP Primary Cardiologist: Marlou Porch  Past Medical History:  Diagnosis Date   Aortic stenosis    CAD (coronary artery disease)    GERD (gastroesophageal reflux disease)    Heart murmur    Kidney stones    S/P TAVR (transcatheter aortic valve replacement) 02/04/2023   75mm S3UR via TF approach with Dr. Angelena Form and Dr. Cyndia Bent    Past Surgical History:  Procedure Laterality Date   COLON RESECTION  2011   Dr Merleen Milliner resection - (22 x 3.5 cm), mesentery (6.0 x 1.5cm) diverticulitis   COLONOSCOPY  10/28/2014   Mild neosigmoid diverticulosis. Internal hemorrhoids. Otherwise normal colonoscopy to terminal ileum   INTRAOPERATIVE TRANSTHORACIC ECHOCARDIOGRAM N/A 02/04/2023   Procedure: INTRAOPERATIVE TRANSTHORACIC ECHOCARDIOGRAM;  Surgeon: Burnell Blanks, MD;  Location: [place];  Service: Open Heart Surgery;   Laterality: N/A;   RIGHT HEART CATH AND CORONARY ANGIOGRAPHY N/A 12/26/2022   Procedure: RIGHT HEART CATH AND CORONARY ANGIOGRAPHY;  Surgeon: Burnell Blanks, MD;  Location: [place] CV [facility];  Service: Cardiovascular;  Laterality: N/A;   TRANSCATHETER AORTIC VALVE REPLACEMENT, TRANSFEMORAL N/A 02/04/2023   Procedure: Transcatheter Aortic Valve Replacement, Transfemoral with 15mm Sapen 3 Ultra vlave;  Surgeon: Burnell Blanks, MD;  Location: [place];  Service: Open Heart Surgery;  Laterality: N/A;    Current Outpatient Medications  Medication Sig Dispense Refill   acetaminophen (TYLENOL) 500 MG tablet Take 500-1,000 mg by mouth every 6 (six) hours as needed (pain.).     aspirin EC 81 MG tablet Take 81 mg by mouth at bedtime.     atorvastatin (LIPITOR) 40 MG tablet TAKE 1 TABLET BY MOUTH EVERY DAY 90 tablet 1   cetirizine (ZYRTEC) 10 MG tablet Take 1 tablet (10 mg total) by mouth daily. (Patient taking differently: Take 10 mg by mouth at bedtime.) 90 tablet 2   hypromellose (GENTEAL) 0.3 % GEL ophthalmic ointment Place 1 Application into both eyes at bedtime. Systane Gel     omeprazole (PRILOSEC) 20 MG capsule Take 1 capsule (20 mg total) by mouth daily. Please call [phone] to schedule an office visit for more refills (Patient taking differently: Take 20 mg by mouth daily as needed (indigestion/heartburn.). Please call [phone] to schedule an office visit for more refills) 90 capsule 1   Polyethyl Glycol-Propyl Glycol (SYSTANE ULTRA) 0.4-0.3 % SOLN Place 1-2 drops into both eyes 3 (three) times daily as needed (dry/irritated eyes.).  Probiotic Product (PROBIOTIC PO) Take 1 capsule by mouth in the morning.     zinc gluconate 50 MG tablet Take 50 mg by mouth daily.     zolpidem (AMBIEN) 10 MG tablet Take 5 mg by mouth as needed (sleep (while on vacations ONLY)).  0   No current facility-administered medications for this visit.    No Known Allergies  Social History    Socioeconomic History   Marital status: Married    Spouse name: Not on file   Number of children: 1   Years of education: Not on file   Highest education level: Not on file  Occupational History   Occupation: Retired from [employer]  Tobacco Use   Smoking status: Never   Smokeless tobacco: Never  Vaping Use   Vaping Use: Never used  Substance and Sexual Activity   Alcohol use: Yes    Alcohol/week: 1.0 - 2.0 standard drink of alcohol    Types: 1 - 2 Glasses of wine per week   Drug use: Never   Sexual activity: Not on file  Other Topics Concern   Not on file  Social History Narrative   Not on file   Social Determinants of Health   Financial Resource Strain: Not on file  Food Insecurity: Not on file  Transportation Needs: Not on file  Physical Activity: Not on file  Stress: Not on file  Social Connections: Not on file  Intimate Partner Violence: Not on file    Family History  Problem Relation Age of Onset   Heart failure Mother    Heart Problems Paternal Grandfather    Colon cancer Neg Hx    Esophageal cancer Neg Hx     Review of Systems:  As stated in the HPI and otherwise negative.   BP 110/70   Pulse 74   Ht 5\' 7"  (1.702 m)   Wt 59.7 kg   SpO2 99%   BMI 20.61 kg/m   Physical Examination:  General: Well developed, well nourished, NAD  HEENT: OP clear, mucus membranes moist  SKIN: warm, dry. No rashes. Neuro: No focal deficits  Musculoskeletal: Muscle strength 5/5 all ext  Psychiatric: Mood and affect normal  Neck: No JVD, no carotid bruits, no thyromegaly, no lymphadenopathy.  Lungs:Clear bilaterally, no wheezes, rhonci, crackles Cardiovascular: Regular rate and rhythm. Soft systolic murmur.  Abdomen:Soft. Bowel sounds present. Non-tender.  Extremities: No lower extremity edema. Pulses are 2 + in the bilateral DP/PT.  EKG:  EKG is ordered today.and shows NSR  Recent Labs: 01/31/2023: ALT 18 02/05/2023: BUN 14; Creatinine, Ser 1.00; Hemoglobin 13.7;  Magnesium 2.0; Platelets 124; Potassium 3.8; Sodium 137    Wt Readings from Last 3 Encounters:  02/13/23 59.7 kg  02/05/23 58.5 kg  01/09/23 59.9 kg    Assessment and Plan:   1. Severe Aortic Valve Stenosis s/p TAVR: He is doing well one week after his TAVR. His groins are soft without hematoma. He does have bruising down his left inner thigh but this is soft and not painful. BP is stable. He reports improvement in his overall energy level and has no dyspnea. (NYHA class 1 symptoms). I will plan to continue his ASA 81 mg daily as well as his other medications listed above. He is instructed to use antibiotics for SBE prophylaxis as indicated.    Labs/ tests ordered today include:   Orders Placed This Encounter  Procedures   EKG 12-Lead   Disposition:   F/U with structural APP in 3 weeks.  Signed, Lauree Chandler, MD, [facility] 02/13/2023 10:24 AM    [facility] [place], [place], [place]  [address] Phone: [phone]; Fax: [phone]

## 2023-02-13 NOTE — Telephone Encounter (Signed)
Phase II referral for Cardiac Rehab faxed to [place] 

## 2023-02-13 NOTE — Patient Instructions (Signed)
Medication Instructions:  No changes *If you need a refill on your cardiac medications before your next appointment, please call your pharmacy*   Lab Work: none If you have labs (blood work) drawn today and your tests are completely normal, you will receive your results only by: [place] (if you have MyChart) OR A paper copy in the mail If you have any lab test that is abnormal or we need to change your treatment, we will call you to review the results.   Testing/Procedures: As planned (echo)   Follow-Up: As planned

## 2023-03-14 ENCOUNTER — Ambulatory Visit (INDEPENDENT_AMBULATORY_CARE_PROVIDER_SITE_OTHER): Payer: Medicare HMO | Admitting: Cardiology

## 2023-03-14 ENCOUNTER — Other Ambulatory Visit: Payer: Self-pay | Admitting: Cardiology

## 2023-03-14 ENCOUNTER — Ambulatory Visit (HOSPITAL_COMMUNITY): Payer: Medicare HMO | Attending: Cardiology

## 2023-03-14 VITALS — BP 138/80 | HR 71 | Ht 67.0 in | Wt 132.4 lb

## 2023-03-14 DIAGNOSIS — Z952 Presence of prosthetic heart valve: Secondary | ICD-10-CM

## 2023-03-14 DIAGNOSIS — I6529 Occlusion and stenosis of unspecified carotid artery: Secondary | ICD-10-CM | POA: Insufficient documentation

## 2023-03-14 DIAGNOSIS — I350 Nonrheumatic aortic (valve) stenosis: Secondary | ICD-10-CM | POA: Insufficient documentation

## 2023-03-14 DIAGNOSIS — I10 Essential (primary) hypertension: Secondary | ICD-10-CM | POA: Insufficient documentation

## 2023-03-14 LAB — ECHOCARDIOGRAM COMPLETE
AR max vel: 3.05 cm2
AV Area VTI: 3.4 cm2
AV Area mean vel: 2.96 cm2
AV Mean grad: 4.5 mmHg
AV Peak grad: 8.4 mmHg
Ao pk vel: 1.45 m/s
Area-P 1/2: 2.16 cm2
S' Lateral: 1.9 cm

## 2023-03-14 MED ORDER — AMOXICILLIN 500 MG PO TABS: ORAL_TABLET | ORAL | 6 refills | Status: AC

## 2023-03-14 NOTE — Progress Notes (Signed)
[facility]ASC[facility] CENTER   MULTIDISCIPLINARY HEART VALVE CLINIC                                     Cardiology Office Note:    Date:  03/14/2023   ID:  Peter Potts, DOB 02/21/1941, MRN [id number]  PCP:  Peter Furnace, NP  [facility] Cardiologist:  Donato Schultz, MD  / Dr. Clifton James, MD & Dr. Laneta Simmers, MD (TAVR)  [facility] Electrophysiologist:  None   Referring MD: Peter Furnace, NP   Chief Complaint  Patient presents with   Follow-up    1 month s/p TAVR   History of Present Illness:    Peter Potts is a 82 y.o. male with a hx of CAD, GERD, carotid artery disease, hyperlipidemia and severe aortic stenosis who presented to [facility] on 02/04/23 for planned TAVR.    Mr. Cone is followed by Dr. Anne Fu for his cardiology care. He was referred to Dr. Clifton James for further discussion of his AS after having chest pain, fatigue and dyspnea. Echocardiogram from 10/08/22 showed LVEF at 60-65% with mild MR, and severe paradoxical low flow/low gradient aortic valve stenosis with mean gradient of 32 mmHg, peak gradient 48 mmHg, AVA 0.77 cm2, DI 0.21, SVI 40.    Plan was for further workup with CT imaging and R/LHC. Cath showed pRCA  lesion at 20% stenosed, ostial LM to mLM lesion is 40% stenosed, mLAD lesion at 40% stenosed, dLAD lesion is 30% stenosed with normal right heart pressures RA 3, RV 33/0/8, PA 32/5 mean 16, PCWP 10, AO 112/55 with recommendations to treat medically for CAD.    He was then evaluated by the multidisciplinary valve team and felt to have severe, symptomatic aortic stenosis and to be a suitable candidate and is now s/p successful TAVR with a 26 mm Edwards Sapien 3 THV via the TF approach on 02/05/23. He did very well post op and was seen in [facility] follow up with Dr. Clifton James and continued to do very well.   Today he is here alone and has no complaints. He is back to his normal activities and is exercising on a regular basis. He denies chest pain, palpitations,  SOB, PND, LE edema, orthopnea, bleeding, dizziness, or syncope.   Past Medical History:  Diagnosis Date   Aortic stenosis    CAD (coronary artery disease)    GERD (gastroesophageal reflux disease)    Heart murmur    Kidney stones    S/P TAVR (transcatheter aortic valve replacement) 02/04/2023   26mm S3UR via TF approach with Dr. Clifton James and Dr. Laneta Simmers    Past Surgical History:  Procedure Laterality Date   COLON RESECTION  2011   Dr Thera Flake resection - (22 x 3.5 cm), mesentery (6.0 x 1.5cm) diverticulitis   COLONOSCOPY  10/28/2014   Mild neosigmoid diverticulosis. Internal hemorrhoids. Otherwise normal colonoscopy to terminal ileum   INTRAOPERATIVE TRANSTHORACIC ECHOCARDIOGRAM N/A 02/04/2023   Procedure: INTRAOPERATIVE TRANSTHORACIC ECHOCARDIOGRAM;  Surgeon: Kathleene Hazel, MD;  Location: [facility] OR;  Service: Open Heart Surgery;  Laterality: N/A;   RIGHT HEART CATH AND CORONARY ANGIOGRAPHY N/A 12/26/2022   Procedure: RIGHT HEART CATH AND CORONARY ANGIOGRAPHY;  Surgeon: Kathleene Hazel, MD;  Location: [facility] INVASIVE CV LAB;  Service: Cardiovascular;  Laterality: N/A;   TRANSCATHETER AORTIC VALVE REPLACEMENT, TRANSFEMORAL N/A 02/04/2023   Procedure: Transcatheter Aortic Valve Replacement, Transfemoral with 26mm  Sapen 3 Ultra vlave;  Surgeon: Kathleene Hazel, MD;  Location: [facility] OR;  Service: Open Heart Surgery;  Laterality: N/A;    Current Medications: Current Meds  Medication Sig   acetaminophen (TYLENOL) 500 MG tablet Take 500-1,000 mg by mouth every 6 (six) hours as needed (pain.).   amoxicillin (AMOXIL) 500 MG tablet Take 4 tablets by mouth 1 hour prior to dental procedures and cleanings.   aspirin EC 81 MG tablet Take 81 mg by mouth at bedtime.   atorvastatin (LIPITOR) 40 MG tablet TAKE 1 TABLET BY MOUTH EVERY DAY   cetirizine (ZYRTEC) 10 MG tablet Take 1 tablet (10 mg total) by mouth daily. (Patient taking differently: Take 10 mg by mouth at bedtime.)    hypromellose (GENTEAL) 0.3 % GEL ophthalmic ointment Place 1 Application into both eyes at bedtime. Systane Gel   omeprazole (PRILOSEC) 20 MG capsule Take 1 capsule (20 mg total) by mouth daily. Please call [phone] to schedule an office visit for more refills (Patient taking differently: Take 20 mg by mouth daily as needed (indigestion/heartburn.). Please call [phone] to schedule an office visit for more refills)   Polyethyl Glycol-Propyl Glycol (SYSTANE ULTRA) 0.4-0.3 % SOLN Place 1-2 drops into both eyes 3 (three) times daily as needed (dry/irritated eyes.).   Probiotic Product (PROBIOTIC PO) Take 1 capsule by mouth in the morning.   zolpidem (AMBIEN) 10 MG tablet Take 5 mg by mouth as needed (sleep (while on vacations ONLY)).     Allergies:   Patient has no known allergies.   Social History   Socioeconomic History   Marital status: Married    Spouse name: Not on file   Number of children: 1   Years of education: Not on file   Highest education level: Not on file  Occupational History   Occupation: Retired from [employer]  Tobacco Use   Smoking status: Never   Smokeless tobacco: Never  Vaping Use   Vaping Use: Never used  Substance and Sexual Activity   Alcohol use: Yes    Alcohol/week: 1.0 - 2.0 standard drink of alcohol    Types: 1 - 2 Glasses of wine per week   Drug use: Never   Sexual activity: Not on file  Other Topics Concern   Not on file  Social History Narrative   Not on file   Social Determinants of Health   Financial Resource Strain: Not on file  Food Insecurity: Not on file  Transportation Needs: Not on file  Physical Activity: Not on file  Stress: Not on file  Social Connections: Not on file    Family History: The patient's family history includes Heart Problems in his paternal grandfather; Heart failure in his mother. There is no history of Colon cancer or Esophageal cancer.  ROS:   Please see the history of present illness.    All other systems  reviewed and are negative.  EKGs/Labs/Other Studies Reviewed:    The following studies were reviewed today:   TAVR OPERATIVE NOTE     Date of Procedure:                02/04/2023   Preoperative Diagnosis:      Severe Aortic Stenosis    Postoperative Diagnosis:    Same    Procedure:        Transcatheter Aortic Valve Replacement - Percutaneous Right Transfemoral Approach             Edwards Sapien 3 Ultra Resilia THV (size 26 mm,  model # [id number], serial # [id number])              Co-Surgeons:                        Alleen Borne, MD and Verne Carrow    Anesthesiologist:                  Marguerita Merles, MD   Echocardiographer:              Charlton Haws, MD   Pre-operative Echo Findings: Severe aortic stenosis Normal left ventricular systolic function   Post-operative Echo Findings: No paravalvular leak Normal left ventricular systolic function   ____________   Echo 02/05/23:    1. Left ventricular ejection fraction, by estimation, is 60 to 65%. The  left ventricle has normal function. The left ventricle has no regional  wall motion abnormalities. There is mild left ventricular hypertrophy.  Left ventricular diastolic parameters  are consistent with Grade I diastolic dysfunction (impaired relaxation).   2. Right ventricular systolic function is normal. The right ventricular  size is normal.   3. Right atrial size was mildly dilated.   4. The mitral valve is abnormal. Trivial mitral valve regurgitation. No  evidence of mitral stenosis. Moderate mitral annular calcification.   5. Well placed 26 mm Sapien 3 valve with no PVL mean gradient 1 peak 3  mmHg AVA 1.6 cm2. The aortic valve has been repaired/replaced. Aortic  valve regurgitation is not visualized. No aortic stenosis is present.   6. The inferior vena cava is dilated in size with >50% respiratory  variability, suggesting right atrial pressure of 8 mmHg.   Echocardiogram 03/14/23:   1. S/P TAVR with  normal mean gradient (5 mmHg), DI 0.64 and no AI.   2. Left ventricular ejection fraction, by estimation, is 60 to 65%. The  left ventricle has normal function. The left ventricle has no regional  wall motion abnormalities. Left ventricular diastolic parameters are  consistent with Grade I diastolic  dysfunction (impaired relaxation).   3. Right ventricular systolic function is normal. The right ventricular  size is normal.   4. The mitral valve is normal in structure. Trivial mitral valve  regurgitation. No evidence of mitral stenosis.   5. The aortic valve has been repaired/replaced. Aortic valve  regurgitation is not visualized. No aortic stenosis is present. There is a  26 mm Sapien 3 Ultra valve present in the aortic position. Procedure Date:  02/04/23.   6. The inferior vena cava is normal in size with greater than 50%  respiratory variability, suggesting right atrial pressure of 3 mmHg.   Comparison(s): No significant change from prior study.    Echocardiogram 03/14/23:   1. S/P TAVR with normal mean gradient (5 mmHg), DI 0.64 and no AI.   2. Left ventricular ejection fraction, by estimation, is 60 to 65%. The  left ventricle has normal function. The left ventricle has no regional  wall motion abnormalities. Left ventricular diastolic parameters are  consistent with Grade I diastolic  dysfunction (impaired relaxation).   3. Right ventricular systolic function is normal. The right ventricular  size is normal.   4. The mitral valve is normal in structure. Trivial mitral valve  regurgitation. No evidence of mitral stenosis.   5. The aortic valve has been repaired/replaced. Aortic valve  regurgitation is not visualized. No aortic stenosis is present. There is a  26 mm Sapien 3 Ultra valve present in  the aortic position. Procedure Date:  02/04/23.   6. The inferior vena cava is normal in size with greater than 50%  respiratory variability, suggesting right atrial pressure of 3  mmHg.   Comparison(s): No significant change from prior study.    EKG:  EKG is not ordered today.    Recent Labs: 01/31/2023: ALT 18 02/05/2023: BUN 14; Creatinine, Ser 1.00; Hemoglobin 13.7; Magnesium 2.0; Platelets 124; Potassium 3.8; Sodium 137   Recent Lipid Panel    Component Value Date/Time   CHOL 129 09/19/2016 0756   TRIG 79 09/19/2016 0756   HDL 49 09/19/2016 0756   CHOLHDL 2.6 09/19/2016 0756   VLDL 16 09/19/2016 0756   LDLCALC 64 09/19/2016 0756    Physical Exam:    VS:  BP 138/80 (BP Location: Left Arm, Patient Position: Sitting, Cuff Size: Normal)   Pulse 71   Ht 5\' 7"  (1.702 m)   Wt 132 lb 6.4 oz (60.1 kg)   SpO2 99%   BMI 20.74 kg/m     Wt Readings from Last 3 Encounters:  03/14/23 132 lb 6.4 oz (60.1 kg)  02/13/23 131 lb 9.6 oz (59.7 kg)  02/05/23 128 lb 15.5 oz (58.5 kg)    General: Well developed, well nourished, NAD Lungs:Clear to ausculation bilaterally. No wheezes, rales, or rhonchi. Breathing is unlabored. Cardiovascular: RRR with S1 S2. No murmurs Extremities: No edema.  Neuro: Alert and oriented. No focal deficits. No facial asymmetry. MAE spontaneously. Psych: Responds to questions appropriately with normal affect.    ASSESSMENT/PLAN:    Severe AS: Patient doing well with NYHA class I symptoms s/p successful TAVR with a 26 mm Edwards Sapien 3 THV via the TF approach on 02/05/23. Echo today with stable valve function with normal LVEF, mean gradient at 4.50mmHg, peak 8.44mmHg, AVA by VTI at 3.40cm2. Continue ASA monotherapy. He will require lifelong dental SBE>>amoxicillin sent to preferred pharm. Plan one year follow up with repeat echo.    CAD: Pre TAVR R/LHC with pRCA lesion at 20% stenosed, ostial LM to mLM lesion is 40% stenosed, mLAD lesion at 40% stenosed, dLAD lesion is 30% stenosed with normal right heart pressures RA 3, RV 33/0/8, PA 32/5 mean 16, PCWP 10, AO 112/55 with recommendations to treat medically for CAD. Denies anginal symptoms.     HTN: Stable today and reports home BPs in the 120-130 range.   GERD: No change   Carotid artery disease: Continue ASA and statin therapy.    Hyperlipidemia: Continue statin.  Medication Adjustments/Labs and Tests Ordered: Current medicines are reviewed at length with the patient today.  Concerns regarding medicines are outlined above.  No orders of the defined types were placed in this encounter.  Meds ordered this encounter  Medications   amoxicillin (AMOXIL) 500 MG tablet    Sig: Take 4 tablets by mouth 1 hour prior to dental procedures and cleanings.    Dispense:  12 tablet    Refill:  6    Patient Instructions  Medication Instructions:  Start Amoxicillin 500 mg, take 4 tablets by mouth 1 hour prior to dental procedures and cleanings. '  *If you need a refill on your cardiac medications before your next appointment, please call your pharmacy*   Lab Work: None ordered   If you have labs (blood work) drawn today and your tests are completely normal, you will receive your results only by: MyChart Message (if you have MyChart) OR A paper copy in the mail If you have any lab  test that is abnormal or we need to change your treatment, we will call you to review the results.   Testing/Procedures: None ordered    Follow-Up: Follow up as scheduled   Other Instructions     Signed, Georgie Chard, NP  03/14/2023 3:07 PM    [facility]

## 2023-03-14 NOTE — Patient Instructions (Signed)
Medication Instructions:  Start Amoxicillin 500 mg, take 4 tablets by mouth 1 hour prior to dental procedures and cleanings. '  *If you need a refill on your cardiac medications before your next appointment, please call your pharmacy*   Lab Work: None ordered   If you have labs (blood work) drawn today and your tests are completely normal, you will receive your results only by: MyChart Message (if you have MyChart) OR A paper copy in the mail If you have any lab test that is abnormal or we need to change your treatment, we will call you to review the results.   Testing/Procedures: None ordered    Follow-Up: Follow up as scheduled   Other Instructions

## 2023-05-16 ENCOUNTER — Other Ambulatory Visit: Payer: Self-pay | Admitting: Cardiology

## 2023-05-27 ENCOUNTER — Telehealth: Payer: Self-pay | Admitting: Cardiology

## 2023-05-27 NOTE — Telephone Encounter (Signed)
   Patient Name: Peter Potts  DOB: 1941-08-11 MRN: [id number]  Primary Cardiologist: Donato Schultz, MD  Chart reviewed as part of pre-operative protocol coverage.   Dental cleaning is considered low risk procedure per guidelines and generally does not require any specific cardiac clearance. It is also generally accepted that for dental cleanings, there is no need to interrupt blood thinner therapy.  SBE prophylaxis is required for the patient from a cardiac standpoint.  I will route this recommendation to the requesting party via Epic fax function and remove from pre-op pool.  Please call with questions.  Carlos Levering, NP 05/27/2023, 9:08 AM

## 2023-05-27 NOTE — Telephone Encounter (Signed)
Please see below.

## 2023-05-27 NOTE — Telephone Encounter (Signed)
   Pre-operative Risk Assessment    Patient Name: Peter Potts  DOB: 05-Oct-1941 MRN: [id number]      Request for Surgical Clearance    Procedure:   dental cleaning  Date of Surgery:  Clearance 05/27/23                                 Surgeon:  Dr. Leanord Asal  Surgeon's Group or Practice Name:  [facility] Phone number:  [phone] Fax number:  [phone]   Type of Clearance Requested:   - Medical    Type of Anesthesia:  None    Additional requests/questions:      SignedFilomena Jungling   05/27/2023, 8:51 AM

## 2023-06-16 ENCOUNTER — Encounter: Payer: Self-pay | Admitting: Cardiology

## 2023-06-16 ENCOUNTER — Ambulatory Visit: Payer: Medicare HMO | Attending: Cardiology | Admitting: Cardiology

## 2023-06-16 VITALS — BP 106/72 | HR 96 | Ht 67.0 in | Wt 132.0 lb

## 2023-06-16 DIAGNOSIS — I2510 Atherosclerotic heart disease of native coronary artery without angina pectoris: Secondary | ICD-10-CM | POA: Diagnosis not present

## 2023-06-16 DIAGNOSIS — Z952 Presence of prosthetic heart valve: Secondary | ICD-10-CM | POA: Diagnosis not present

## 2023-06-16 DIAGNOSIS — I6529 Occlusion and stenosis of unspecified carotid artery: Secondary | ICD-10-CM | POA: Diagnosis not present

## 2023-06-16 NOTE — Progress Notes (Signed)
  Cardiology Office Note:  .   Date:  06/16/2023  ID:  Peter Potts, DOB Oct 22, 1941, MRN [id number] PCP: Felipa Furnace, NP  [facility] Providers Cardiologist:  Donato Schultz, MD    History of Present Illness: .   Peter Potts is a 82 y.o. male here for follow-up coronary artery disease status post TAVR.  Normal EF.  Mild nonobstructive disease on cath.  12/26/2022.  TAVR was performed on 02/04/23 26 mm Edwards SAPIEN.  Pickleball 1.5 hr ROS: Equilibrium  Studies Reviewed: .       Echocardiogram 03/14/2023-EF 65%.  Normal TAVR valve.  Risk Assessment/Calculations:            Physical Exam:   VS:  BP 106/72   Pulse 96   Ht 5\' 7"  (1.702 m)   Wt 132 lb (59.9 kg)   SpO2 97%   BMI 20.67 kg/m    Wt Readings from Last 3 Encounters:  06/16/23 132 lb (59.9 kg)  03/14/23 132 lb 6.4 oz (60.1 kg)  02/13/23 131 lb 9.6 oz (59.7 kg)    GEN: Well nourished, well developed in no acute distress NECK: No JVD; No carotid bruits CARDIAC: RRR, soft systolic murmur,no rubs, gallops RESPIRATORY:  Clear to auscultation without rales, wheezing or rhonchi  ABDOMEN: Soft, non-tender, non-distended EXTREMITIES:  No edema; No deformity   ASSESSMENT AND PLAN: .    Mild Carotid disease bilaterally - Continue with atorvastatin as well as aspirin.  No symptoms.  TAVR 2024 - Check echocardiogram in 1 year.  Stable.  Asymptomatic.  Dental prophylaxis.  Disequilibrium - He will check with his primary care physician.  Coronary artery disease - Mild.  Continue with current medication management which includes statin and aspirin.      Dispo: 1 year follow-up  Signed, Donato Schultz, MD

## 2023-06-16 NOTE — Patient Instructions (Signed)

## 2023-11-15 ENCOUNTER — Other Ambulatory Visit: Payer: Self-pay | Admitting: Medical Genetics

## 2023-12-18 ENCOUNTER — Other Ambulatory Visit (HOSPITAL_COMMUNITY)
Admission: RE | Admit: 2023-12-18 | Discharge: 2023-12-18 | Disposition: A | Discharge status: Home / Self Care | Payer: Self-pay | Source: Ambulatory Visit | Attending: *Deleted | Admitting: *Deleted

## 2023-12-28 LAB — GENECONNECT MOLECULAR SCREEN: Genetic Analysis Overall Interpretation: NEGATIVE

## 2024-02-07 NOTE — Progress Notes (Unsigned)
 [facility] AND VASCULAR CENTER   MULTIDISCIPLINARY HEART VALVE CLINIC                                     Cardiology Office Note:    Date:  02/09/2024   ID:  Peter Potts, DOB 11/13/1941, MRN [id number]  PCP:  Felipa Furnace, NP  [facility] Cardiologist:  Donato Schultz, MD / Dr. Clifton James, MD & Dr. Laneta Simmers, MD (TAVR)  [facility] Electrophysiologist:  None   Referring MD: Knox Royalty D, NP   1 year s/p TAVR  History of Present Illness:    Peter Potts is a 83 y.o. male with a hx of CAD, GERD, CAD, HLD, and severe aortic stenosis s/p TAVR (02/04/23) who presents to clinic for follow up.  Echocardiogram 10/08/22 showed LVEF 60-65% with mild MR, and severe paradoxical low flow/low gradient aortic valve stenosis with mean gradient of 32 mmHg, AVA 0.77 cm2, DI 0.21. [facility] 12/26/22 showed pRCA lesion at 20% stenosed, ostial LM to mLM lesion is 40% stenosed, mLAD lesion at 40% stenosed, dLAD lesion is 30% stenosed with normal right heart pressures. S/p TAVR with a 26 mm Edwards Sapien 3 THV via the TF approach on 02/05/23.  Today the patient presents to clinic for follow up. Here alone. Playing pickle ball routinely and plays 45 minutes and has to take a break. No CP or SOB. No LE edema, orthopnea or PND. No dizziness or syncope. No blood in stool or urine. No palpitations.     Past Medical History:  Diagnosis Date   Aortic stenosis    CAD (coronary artery disease)    GERD (gastroesophageal reflux disease)    Heart murmur    Kidney stones    S/P TAVR (transcatheter aortic valve replacement) 02/04/2023   26mm S3UR via TF approach with Dr. Clifton James and Dr. Laneta Simmers     Current Medications: Current Meds  Medication Sig   amoxicillin (AMOXIL) 500 MG tablet Take 4 tablets by mouth 1 hour prior to dental procedures and cleanings.   aspirin EC 81 MG tablet Take 81 mg by mouth at bedtime.   atorvastatin (LIPITOR) 40 MG tablet TAKE 1 TABLET BY MOUTH EVERY DAY   cetirizine  (ZYRTEC) 10 MG tablet Take 1 tablet (10 mg total) by mouth daily. (Patient taking differently: Take 10 mg by mouth at bedtime.)   Coenzyme Q10 100 MG capsule Take 100 mg by mouth daily.   omeprazole (PRILOSEC) 20 MG capsule Take 1 capsule (20 mg total) by mouth daily. Please call [phone] to schedule an office visit for more refills (Patient taking differently: Take 20 mg by mouth daily as needed (indigestion/heartburn.). Please call [phone] to schedule an office visit for more refills)   Polyethyl Glycol-Propyl Glycol (SYSTANE ULTRA) 0.4-0.3 % SOLN Place 1-2 drops into both eyes 3 (three) times daily as needed (dry/irritated eyes.).   Probiotic Product (PROBIOTIC PO) Take 1 capsule by mouth in the morning.   zolpidem (AMBIEN) 10 MG tablet Take 5 mg by mouth as needed (sleep (while on vacations ONLY)).      ROS:   Please see the history of present illness.    All other systems reviewed and are negative.  EKGs       Risk Assessment/Calculations:           Physical Exam:    VS:  BP 110/76   Pulse 70  Ht 5\' 7"  (1.702 m)   Wt 128 lb (58.1 kg)   SpO2 98%   BMI 20.05 kg/m     Wt Readings from Last 3 Encounters:  02/09/24 128 lb (58.1 kg)  06/16/23 132 lb (59.9 kg)  03/14/23 132 lb 6.4 oz (60.1 kg)     GEN: Well nourished, well developed in no acute distress NECK: No JVD CARDIAC: RRR, no murmurs, rubs, gallops RESPIRATORY:  Clear to auscultation without rales, wheezing or rhonchi  ABDOMEN: Soft, non-tender, non-distended EXTREMITIES:  No edema; No deformity.   ASSESSMENT:    1. S/P TAVR (transcatheter aortic valve replacement)   2. Essential hypertension   3. Hyperlipidemia, unspecified hyperlipidemia type   4. Gastroesophageal reflux disease, unspecified whether esophagitis present     PLAN:    In order of problems listed above:  Severe AS s/p TAVR:  -- Echo today shows EF 65%, mod LVH, normally functioning TAVR with a mean gradient of 6 mm hg and no  PVL.  -- NYHA class I symptoms.  -- Continue Aspirin 81mg  daily.  -- SBE discussed; he has amoxicillin. Refill sent.  -- Continue regular follow up with Dr. Anne Fu  HTN: -- BP well controlled today.  -- Not on any antihypertensives.  HLD: -- Continue Lipitor 40mg  daily.  GERD: -- Continue Prilosec 20mg  daily.    Medication Adjustments/Labs and Tests Ordered: Current medicines are reviewed at length with the patient today.  Concerns regarding medicines are outlined above.  No orders of the defined types were placed in this encounter.  No orders of the defined types were placed in this encounter.   There are no Patient Instructions on file for this visit.   Signed, Cline Crock, PA-C  02/09/2024 10:47 AM    [facility]

## 2024-02-09 ENCOUNTER — Ambulatory Visit (HOSPITAL_BASED_OUTPATIENT_CLINIC_OR_DEPARTMENT_OTHER): Payer: Medicare HMO

## 2024-02-09 ENCOUNTER — Ambulatory Visit (HOSPITAL_COMMUNITY): Payer: Medicare HMO | Attending: Cardiology | Admitting: Physician Assistant

## 2024-02-09 VITALS — BP 110/76 | HR 70 | Ht 67.0 in | Wt 128.0 lb

## 2024-02-09 DIAGNOSIS — Z952 Presence of prosthetic heart valve: Secondary | ICD-10-CM | POA: Diagnosis present

## 2024-02-09 DIAGNOSIS — E785 Hyperlipidemia, unspecified: Secondary | ICD-10-CM | POA: Insufficient documentation

## 2024-02-09 DIAGNOSIS — I10 Essential (primary) hypertension: Secondary | ICD-10-CM | POA: Insufficient documentation

## 2024-02-09 DIAGNOSIS — K219 Gastro-esophageal reflux disease without esophagitis: Secondary | ICD-10-CM | POA: Diagnosis not present

## 2024-02-09 LAB — ECHOCARDIOGRAM COMPLETE
AR max vel: 1.68 cm2
AV Area VTI: 1.75 cm2
AV Area mean vel: 1.56 cm2
AV Mean grad: 6 mmHg
AV Peak grad: 10.4 mmHg
Ao pk vel: 1.61 m/s
Area-P 1/2: 2.8 cm2
S' Lateral: 2.3 cm

## 2024-07-20 ENCOUNTER — Other Ambulatory Visit: Payer: Self-pay | Admitting: Cardiology

## 2024-10-01 ENCOUNTER — Encounter: Payer: Self-pay | Admitting: Cardiology

## 2024-10-01 ENCOUNTER — Ambulatory Visit: Attending: Cardiology | Admitting: Cardiology

## 2024-10-01 VITALS — BP 132/82 | HR 83 | Ht 67.5 in | Wt 129.2 lb

## 2024-10-01 DIAGNOSIS — E785 Hyperlipidemia, unspecified: Secondary | ICD-10-CM

## 2024-10-01 DIAGNOSIS — I10 Essential (primary) hypertension: Secondary | ICD-10-CM

## 2024-10-01 DIAGNOSIS — I2510 Atherosclerotic heart disease of native coronary artery without angina pectoris: Secondary | ICD-10-CM

## 2024-10-01 DIAGNOSIS — Z952 Presence of prosthetic heart valve: Secondary | ICD-10-CM

## 2024-10-01 NOTE — Patient Instructions (Signed)

## 2024-10-01 NOTE — Progress Notes (Signed)
 Cardiology Office Note:  .   Date:  10/01/2024  ID:  Peter Potts, DOB Oct 29, 1941, MRN [id number] PCP: Gayle Jeoffrey BIRCH, NP  [facility] Providers Cardiologist:  Oneil Parchment, MD    History of Present Illness: .   Peter Potts is a 83 y.o. male Discussed the use of AI scribe   History of Present Illness Peter Potts is an 83 year old male with severe aortic stenosis and coronary artery disease who presents for a follow-up after TAVR.  He underwent a transcatheter aortic valve replacement (TAVR) on February 04, 2023, due to severe aortic stenosis. Prior to the procedure, a right and left heart catheterization on December 26, 2022, revealed a 20% proximal RCA lesion, a 40% osteo left main/mid left main lesion, a 40% mid LAD lesion, and a 30% distal LAD lesion. The right side pressures were normal. A 26 mm Edward Sapiens 3 valve was placed via the transfemoral approach.  He feels well with no complaints and enjoys playing pickleball. He is on amoxicillin  for SBE prophylaxis, atorvastatin  40 mg for hyperlipidemia, and Prilosec for GERD. His blood pressure is well controlled.  He recalls that during the catheterization, small blockages were noted, but they were deemed mild to moderate and not requiring immediate intervention.  He had a cholesterol panel done less than a month ago, with an LDL of 55 mg/dL as of September 07, 2024.       Studies Reviewed: SABRA   EKG Interpretation Date/Time:  Friday October 01 2024 09:29:03 EST Ventricular Rate:  83 PR Interval:  136 QRS Duration:  96 QT Interval:  366 QTC Calculation: 430 R Axis:   71  Text Interpretation: Normal sinus rhythm Nonspecific ST abnormality When compared with ECG of 05-Feb-2023 03:41, T wave amplitude has increased in Inferior leads Confirmed by Parchment Oneil ([id number]) on 10/01/2024 9:30:29 AM    Results LABS LDL: 55 (09/07/2024) Creatinine: 1.17 ALT: 24  DIAGNOSTIC Right and left heart  catheterization: Proximal RCA lesion 20%, ostial left main/mid left main lesion 40%, mid LAD 40%, distal LAD 30%, normal right side pressures (12/26/2022) Echocardiogram: Normal functioning TAVR with mean gradient 6 mmHg, no paravalvular leak, class I NYHA symptoms EKG: Normal Risk Assessment/Calculations:            Physical Exam:   VS:  BP 132/82   Pulse 83   Ht 5' 7.5 (1.715 m)   Wt 129 lb 3.2 oz (58.6 kg)   SpO2 98%   BMI 19.94 kg/m    Wt Readings from Last 3 Encounters:  10/01/24 129 lb 3.2 oz (58.6 kg)  02/09/24 128 lb (58.1 kg)  06/16/23 132 lb (59.9 kg)    GEN: Thin in no acute distress NECK: No JVD; No carotid bruits CARDIAC: RRR, no murmurs, no rubs, no gallops RESPIRATORY:  Clear to auscultation without rales, wheezing or rhonchi  ABDOMEN: Soft, non-tender, non-distended EXTREMITIES:  No edema; No deformity   ASSESSMENT AND PLAN: .    Assessment and Plan Assessment & Plan Status post TAVR for severe aortic stenosis with normal prosthetic valve function Status post TAVR on March 19th, 2024, with normal prosthetic valve function. Echocardiogram shows normal functioning TAVR with a mean gradient of 6 mmHg and no paravalvular leak. Class I NYHA symptoms. No new complaints or unusual findings on EKG. - Continue amoxicillin  for SBE prophylaxis prior to dental procedures. - Will schedule echocardiogram in one year unless symptoms change.  Coronary artery disease  with mild non-obstructive lesions Coronary artery disease with mild non-obstructive lesions identified during right and left heart catheterization on February 8th, 2024. Lesions include proximal RCA lesion of 20%, osteo left main/mid left main lesion of 40%, mid LAD 40%, and distal LAD 30%. No current symptoms of worsening coronary artery disease. - Continue atorvastatin  40 mg daily. - Monitor for symptoms such as chest pain or shortness of breath.  Essential hypertension, well controlled Blood pressure is well  controlled with current management.  Hyperlipidemia on statin therapy Hyperlipidemia managed with atorvastatin . Recent cholesterol panel results were not available, but previous results were satisfactory. - Continue atorvastatin  40 mg daily. - Continue to monitor lipid panel  Mild bilateral carotid artery disease, stable Mild bilateral carotid artery disease with minimal plaque. No need for further carotid imaging at this time. - No further carotid imaging required unless symptoms develop.         Dispo: 1 yr  Signed, Oneil Parchment, MD
